# Patient Record
Sex: Female | Born: 1975 | Race: White | Hispanic: No | Marital: Married | Smoking: Never smoker
Health system: Southern US, Community
[De-identification: ages and names within clinical notes are randomized; demographics above are authoritative.]

## PROBLEM LIST (undated history)

## (undated) DIAGNOSIS — D485 Neoplasm of uncertain behavior of skin: Secondary | ICD-10-CM

## (undated) DIAGNOSIS — G43909 Migraine, unspecified, not intractable, without status migrainosus: Secondary | ICD-10-CM

## (undated) DIAGNOSIS — F329 Major depressive disorder, single episode, unspecified: Secondary | ICD-10-CM

## (undated) DIAGNOSIS — F32A Depression, unspecified: Secondary | ICD-10-CM

## (undated) DIAGNOSIS — R112 Nausea with vomiting, unspecified: Secondary | ICD-10-CM

## (undated) DIAGNOSIS — Z889 Allergy status to unspecified drugs, medicaments and biological substances status: Secondary | ICD-10-CM

## (undated) DIAGNOSIS — M4850XA Collapsed vertebra, not elsewhere classified, site unspecified, initial encounter for fracture: Secondary | ICD-10-CM

## (undated) DIAGNOSIS — Z9889 Other specified postprocedural states: Secondary | ICD-10-CM

## (undated) DIAGNOSIS — R109 Unspecified abdominal pain: Secondary | ICD-10-CM

## (undated) DIAGNOSIS — H7290 Unspecified perforation of tympanic membrane, unspecified ear: Secondary | ICD-10-CM

## (undated) DIAGNOSIS — I10 Essential (primary) hypertension: Secondary | ICD-10-CM

## (undated) DIAGNOSIS — N926 Irregular menstruation, unspecified: Secondary | ICD-10-CM

## (undated) HISTORY — PX: ABDOMINAL HYSTERECTOMY: SHX81

## (undated) HISTORY — PX: OTHER SURGICAL HISTORY: SHX169

## (undated) HISTORY — PX: BACK SURGERY: SHX140

## (undated) HISTORY — DX: Unspecified perforation of tympanic membrane, unspecified ear: H72.90

## (undated) HISTORY — DX: Other specified postprocedural states: Z98.890

## (undated) HISTORY — DX: Collapsed vertebra, not elsewhere classified, site unspecified, initial encounter for fracture: M48.50XA

## (undated) HISTORY — PX: MYOMECTOMY: SHX85

## (undated) HISTORY — DX: Allergy status to unspecified drugs, medicaments and biological substances: Z88.9

## (undated) HISTORY — DX: Unspecified abdominal pain: R10.9

## (undated) HISTORY — DX: Migraine, unspecified, not intractable, without status migrainosus: G43.909

## (undated) HISTORY — DX: Depression, unspecified: F32.A

## (undated) HISTORY — DX: Irregular menstruation, unspecified: N92.6

## (undated) HISTORY — PX: ANKLE FRACTURE SURGERY: SHX122

## (undated) HISTORY — DX: Major depressive disorder, single episode, unspecified: F32.9

## (undated) HISTORY — PX: FRACTURE SURGERY: SHX138

## (undated) HISTORY — DX: Neoplasm of uncertain behavior of skin: D48.5

---

## 1997-08-22 ENCOUNTER — Other Ambulatory Visit: Admission: RE | Admit: 1997-08-22 | Discharge: 1997-08-22 | Payer: Self-pay | Admitting: *Deleted

## 1998-10-29 ENCOUNTER — Other Ambulatory Visit: Admission: RE | Admit: 1998-10-29 | Discharge: 1998-10-29 | Payer: Self-pay | Admitting: Obstetrics and Gynecology

## 1999-11-26 ENCOUNTER — Encounter: Payer: Self-pay | Admitting: *Deleted

## 1999-11-26 ENCOUNTER — Encounter: Admission: RE | Admit: 1999-11-26 | Discharge: 1999-11-26 | Payer: Self-pay | Admitting: *Deleted

## 1999-11-30 ENCOUNTER — Other Ambulatory Visit: Admission: RE | Admit: 1999-11-30 | Discharge: 1999-11-30 | Payer: Self-pay | Admitting: *Deleted

## 2000-12-26 ENCOUNTER — Other Ambulatory Visit: Admission: RE | Admit: 2000-12-26 | Discharge: 2000-12-26 | Payer: Self-pay | Admitting: Obstetrics and Gynecology

## 2002-01-12 ENCOUNTER — Other Ambulatory Visit: Admission: RE | Admit: 2002-01-12 | Discharge: 2002-01-12 | Payer: Self-pay | Admitting: Obstetrics and Gynecology

## 2002-03-06 ENCOUNTER — Encounter: Payer: Self-pay | Admitting: *Deleted

## 2002-03-06 ENCOUNTER — Emergency Department (HOSPITAL_COMMUNITY): Admission: EM | Admit: 2002-03-06 | Discharge: 2002-03-06 | Payer: Self-pay | Admitting: *Deleted

## 2003-02-14 ENCOUNTER — Other Ambulatory Visit: Admission: RE | Admit: 2003-02-14 | Discharge: 2003-02-14 | Payer: Self-pay | Admitting: Obstetrics and Gynecology

## 2004-01-06 ENCOUNTER — Emergency Department (HOSPITAL_COMMUNITY): Admission: EM | Admit: 2004-01-06 | Discharge: 2004-01-06 | Payer: Self-pay | Admitting: Emergency Medicine

## 2004-01-21 ENCOUNTER — Encounter: Admission: RE | Admit: 2004-01-21 | Discharge: 2004-01-21 | Payer: Self-pay | Admitting: Gastroenterology

## 2004-02-14 ENCOUNTER — Ambulatory Visit: Payer: Self-pay

## 2004-02-21 ENCOUNTER — Ambulatory Visit (HOSPITAL_COMMUNITY): Admission: RE | Admit: 2004-02-21 | Discharge: 2004-02-21 | Payer: Self-pay | Admitting: Gastroenterology

## 2005-01-22 ENCOUNTER — Ambulatory Visit: Payer: Self-pay | Admitting: Internal Medicine

## 2006-11-30 DIAGNOSIS — F329 Major depressive disorder, single episode, unspecified: Secondary | ICD-10-CM

## 2006-11-30 DIAGNOSIS — F32A Depression, unspecified: Secondary | ICD-10-CM | POA: Insufficient documentation

## 2006-11-30 DIAGNOSIS — F3289 Other specified depressive episodes: Secondary | ICD-10-CM | POA: Insufficient documentation

## 2007-06-19 ENCOUNTER — Ambulatory Visit: Payer: Self-pay | Admitting: Internal Medicine

## 2007-06-19 DIAGNOSIS — R1012 Left upper quadrant pain: Secondary | ICD-10-CM | POA: Insufficient documentation

## 2007-06-19 LAB — CONVERTED CEMR LAB
Beta hcg, urine, semiquantitative: NEGATIVE
Bilirubin Urine: NEGATIVE
Blood in Urine, dipstick: NEGATIVE
Glucose, Urine, Semiquant: NEGATIVE
Ketones, urine, test strip: NEGATIVE
Protein, U semiquant: NEGATIVE

## 2007-08-18 ENCOUNTER — Encounter: Admission: RE | Admit: 2007-08-18 | Discharge: 2007-08-18 | Payer: Self-pay | Admitting: Obstetrics and Gynecology

## 2009-06-30 ENCOUNTER — Ambulatory Visit: Payer: Self-pay | Admitting: Internal Medicine

## 2009-06-30 DIAGNOSIS — R109 Unspecified abdominal pain: Secondary | ICD-10-CM | POA: Insufficient documentation

## 2009-07-11 ENCOUNTER — Encounter: Payer: Self-pay | Admitting: Internal Medicine

## 2009-07-17 ENCOUNTER — Ambulatory Visit (HOSPITAL_COMMUNITY): Admission: RE | Admit: 2009-07-17 | Discharge: 2009-07-17 | Payer: Self-pay | Admitting: Internal Medicine

## 2009-07-31 ENCOUNTER — Encounter: Payer: Self-pay | Admitting: Internal Medicine

## 2010-04-10 ENCOUNTER — Ambulatory Visit (HOSPITAL_COMMUNITY)
Admission: RE | Admit: 2010-04-10 | Discharge: 2010-04-10 | Payer: Self-pay | Source: Home / Self Care | Attending: Obstetrics and Gynecology | Admitting: Obstetrics and Gynecology

## 2010-05-14 NOTE — Assessment & Plan Note (Signed)
Summary: SWOLLEN GLANDS IN GROIN AREA//CCM   Vital Signs:  Patient profile:   36 year old female Height:      65 inches Weight:      188 pounds BMI:     31.40 Temp:     99.0 degrees F oral Pulse rate:   66 / minute BP sitting:   120 / 80  (right arm) Cuff size:   regular  Vitals Entered By: Romualdo Bolk, CMA Duncan Dull) (June 30, 2009 3:55 PM) CC: Pt is having some tenderness in rt groin area around the same place where she had the melanomia removed 06/16/2007. She is concerned that it may have metasized into the lymph nodes. Pt has been there for about 2.5 weeks.   History of Present Illness: [name] comesin today for   for acute visit for above  last visit was 2 years ago.  She has done well since that time.   She had  a pedicure  2 weeks before onset of swelling .Marland KitchenNail  cut very short  but no no pain  2.5 weeks  ago onset of tender area right groin swelling ? LN  unsure .Marland Kitchen No fever or orther swellings. No GU or Gi problems .  It hurts to cough or sneeze and mover hip internal rottation but not to walk.   Atypical spiz nevus  right thigh  removed years ago by Dr Londell Moh   on same leg.  No cat scratch .     Preventive Screening-Counseling & Management  Alcohol-Tobacco     Alcohol drinks/day: 0     Smoking Status: never  Caffeine-Diet-Exercise     Caffeine use/day: 2     Does Patient Exercise: yes  Current Medications (verified): 1)  Zovia 1/35e (28) 1-35 Mg-Mcg  Tabs (Ethynodiol Diac-Eth Estradiol) 2)  Septra Ds 800-160 Mg  Tabs (Sulfamethoxazole-Trimethoprim) 3)  Calcium 500/d 500-200 Mg-Unit  Tabs (Calcium Carbonate-Vitamin D) 4)  Multiple Vitamins   Tabs (Multiple Vitamin)  Allergies (verified): No Known Drug Allergies  Past History:  Care Management: Gynecology: Layne Dermatology: Margo Aye  Social History: Occupation: Divorced Never Smoked Alcohol use-yes Drug use-no Regular exercise-yes works  [employer]   Caffeine use/day:  2  Review of  Systems  The patient denies anorexia, fever, weight loss, weight gain, vision loss, decreased hearing, hoarseness, chest pain, melena, hematochezia, hematuria, incontinence, genital sores, difficulty walking, abnormal bleeding, and angioedema.         no sweats or systemic symptom   Physical Exam  General:  Well-developed,well-nourished,in no acute distress; alert,appropriate and cooperative throughout examination Head:  normocephalic and atraumatic.   Abdomen:  Bowel sounds positive,abdomen soft and non-tender without masses, organomegaly or  noted.  right groin inguinal area is fulll but no dicrete mass and no change with standing.  Msk:  rom nln  of hip pain with internnal rotation but nl with walking.  Pulses:  nl cap arefill  Extremities:  no clubbing cyanosis or edema  Neurologic:  non focal grossly  Skin:  turgor normal, color normal, no ecchymoses, and no petechiae.    old tatoo  right  leg old scar right thigh  shaving bumps folliculitis in pubic groin area no boids or tenderness  Inguinal Nodes:  no L inguinal adenopathy.  right area full but no discrete  Ln      Impression & Recommendations:  Problem # 1:  INGUINAL PAIN, RIGHT (ICD-789.09)  with fullness  but cant feel discrete LN   . However  will rx for reactive LN   and also get surgery consult to assess for poss hernia as this is worse with coughing .       Orders: Surgical Referral (Surgery)  Complete Medication List: 1)  Zovia 1/35e (28) 1-35 Mg-mcg Tabs (Ethynodiol diac-eth estradiol) 2)  Septra Ds 800-160 Mg Tabs (Sulfamethoxazole-trimethoprim) 3)  Calcium 500/d 500-200 Mg-unit Tabs (Calcium carbonate-vitamin d) 4)  Multiple Vitamins Tabs (Multiple vitamin) 5)  Cephalexin 500 Mg Caps (Cephalexin) .Marland Kitchen.. 1 by mouth three times a day for 7 days  Patient Instructions: 1)  take the   antibiotic  as if this is reactive  Lymph node .    2)  Can do surgery   consult   in the meantime to assess for possible hernia.   Prescriptions: CEPHALEXIN 500 MG CAPS (CEPHALEXIN) 1 by mouth three times a day for 7 days  #21 x 0   Entered and Authorized by:   Madelin Headings MD   Signed by:   Madelin Headings MD on 06/30/2009   Method used:   Electronically to        [id number]* (retail)       [address]       [place], [place]  [address]       Ph: [phone]       Fax: [phone]   RxID:   [id number] CEPHALEXIN 500 MG CAPS (CEPHALEXIN) 1 by mouth three times a day for 7 days  #21 x 0   Entered and Authorized by:   Madelin Headings MD   Signed by:   Madelin Headings MD on 06/30/2009   Method used:   Electronically to        [facility]. [phone]* (retail)       [address] [place], [place]  [address]       Ph: [phone]       Fax: [phone]   RxID:   [id number]  [facility] and spoke with Nelda Bucks. I told her to cancel the rx. Romualdo Bolk, CMA Duncan Dull)  June 30, 2009 4:34 PM

## 2010-05-14 NOTE — Consult Note (Signed)
Summary: [facility] Surgery   Imported By: Maryln Gottron 07/28/2009 14:28:50  _____________________________________________________________________  External Attachment:    Type:   Image     Comment:   External Document

## 2010-05-14 NOTE — Procedures (Signed)
Summary: Colonoscopy Report/[facility]  Colonoscopy Report/[facility]   Imported By: Maryln Gottron 08/19/2009 09:15:00  _____________________________________________________________________  External Attachment:    Type:   Image     Comment:   External Document

## 2010-06-22 LAB — HCG, SERUM, QUALITATIVE: Preg, Serum: NEGATIVE

## 2010-06-22 LAB — CBC
Hemoglobin: 12.2 g/dL (ref 12.0–15.0)
MCHC: 33.6 g/dL (ref 30.0–36.0)
RBC: 4.1 MIL/uL (ref 3.87–5.11)
WBC: 9.9 10*3/uL (ref 4.0–10.5)

## 2010-07-10 ENCOUNTER — Ambulatory Visit (INDEPENDENT_AMBULATORY_CARE_PROVIDER_SITE_OTHER): Payer: 59 | Admitting: Internal Medicine

## 2010-07-10 ENCOUNTER — Encounter: Payer: Self-pay | Admitting: Internal Medicine

## 2010-07-10 VITALS — BP 158/98 | HR 66 | Ht 64.25 in | Wt 186.0 lb

## 2010-07-10 DIAGNOSIS — R519 Headache, unspecified: Secondary | ICD-10-CM | POA: Insufficient documentation

## 2010-07-10 DIAGNOSIS — R51 Headache: Secondary | ICD-10-CM | POA: Insufficient documentation

## 2010-07-10 DIAGNOSIS — H02729 Madarosis of unspecified eye, unspecified eyelid and periocular area: Secondary | ICD-10-CM

## 2010-07-10 DIAGNOSIS — I10 Essential (primary) hypertension: Secondary | ICD-10-CM | POA: Insufficient documentation

## 2010-07-10 MED ORDER — LISINOPRIL-HYDROCHLOROTHIAZIDE 10-12.5 MG PO TABS: 1.0000 | ORAL_TABLET | Freq: Every day | ORAL | Status: DC

## 2010-07-10 NOTE — Progress Notes (Signed)
Subjective:    Patient ID: [name], female    DOB: Oct 05, 1975, 35 y.o.   MRN: [id number]  HPI Patient comes in today for an acute visit SDA because of problems with headache and blood pressure. Her last visit with me was a year or more ago. She has been generally well but had a problem with fibroids and had a myomectomy in December of 2011. This was by Dr. Billy Coast. It was noted that she had some elevated blood pressure at that time but felt to be related to the situation. However she has been monitoring her blood pressure since that time including at work with Dr. Ardyth Gal  and her blood pressures have been running 150-160 systolic.    In addition for 3 weeks she's been having some headaches mostly on the right in the afternoons and has been taking Advil Aleve or Excedrin almost every day for 3 weeks. She is also concerned because the right lateral side of her eye lashes are thinned out and not growing back. She has no vision change diplopia syncope or head injury.  She is on combined OCPs for period regulation which is now regular and scant no need for contraception currently.  No tobacco rare alcohol no decongestants currently  Review of Systems Negative for chest pain shortness of breath rest as per history of present illness   she had a respiratory difficulty with a head cold chest cold that lasted 2-3 months. Dr. Merla Riches treated her with azithromycin and prednisone and since that time she is improved  Past Medical History  Diagnosis Date  . Migraines   . Multiple allergies   . Depression   . Abdominal  pain, other specified site   . Menstrual irregularity   . Atypical Spitz nevus    Past Surgical History  Procedure Date  . Hardware l ankle   . Myomectomy     12 2011  with D and c     reports that she has never smoked. She does not have any smokeless tobacco history on file. She reports that she drinks alcohol. She reports that she does not use illicit drugs. family  history includes Arthritis in her other; Cancer in her other; Hypertension in her mother; Mental retardation in her other; and Stroke in her mother. No Known Allergies     Objective:   Physical Exam Well-developed well-nourished in no acute distress. HEENT normocephalic TMs clear eyes PE RRL EOMs are full she does have eye lashes on the right but slightly decreased in the left.  OP clear tongue midline nares patent no congestion Neck supple without masses thyromegaly or bruit Chest:  Clear to A&P without wheezes rales or rhonchi CV:  S1-S2 no gallops or murmurs peripheral perfusion is normal  BP readings =  Both arms  Abdomen:  Sof,t normal bowel sounds without hepatosplenomegaly, no guarding rebound or masses no CVA tenderness No clubbing cyanosis or edema        Assessment & Plan:    Hypertension  Family history and is on combined pills over age 40. Discussed the situation she must stop the estrogen component and we will stop today and she will discuss this with her gynecologist. Implement-diet lifestyle changes and begin medication lisinopril HCTZ pregnancy precautions. We'll followup in about one month have her monitor at home plan laboratory studies at that time. To include thyroid studies.  Headaches:  Seems to be afternoon headaches more right sided some throbbing her taking of ibuprofen and Aleve may  be counterproductive concern about rebound and affect on blood pressure. Given a headache calendar to return when she comes back.

## 2010-07-10 NOTE — Patient Instructions (Signed)
Stop the  OCPs    Contact Dr Billy Coast office about this . Monitor your bp readings Begin medication each day. Avoid sodium.  Dash Diet

## 2010-08-21 ENCOUNTER — Ambulatory Visit: Payer: 59 | Admitting: Internal Medicine

## 2010-08-21 DIAGNOSIS — Z0289 Encounter for other administrative examinations: Secondary | ICD-10-CM

## 2010-08-28 NOTE — Op Note (Signed)
NAMEJENICA, COSTILOW               ACCOUNT NO.:  [id number]   MEDICAL RECORD NO.:  [id number]          PATIENT TYPE:  AMB   LOCATION:  ENDO                         FACILITY:  [facility]   PHYSICIAN:  Petra Kuba, M.D.    DATE OF BIRTH:  1975-08-02   DATE OF PROCEDURE:  02/21/2004  DATE OF DISCHARGE:                                 OPERATIVE REPORT   PROCEDURE:  Colonoscopy.   INDICATIONS:  Patient with a family history of colon cancer and colon polyps  as well as abdominal pain, questionable etiology, nondiagnostic workup to  date.  Consent was signed after risks, benefits, methods, and options  thoroughly discussed in the office.   MEDICINES USED:  Demerol 100 mg, Versed 10 mg, Phenergan 25 mg.   PROCEDURE:  Rectal inspection pertinent for external hemorrhoids, small.  Digital exam was negative.  Video pediatric adjustable colonoscope was  inserted, easily advanced around the colon to the cecum.  This did not  require any abdominal pressure or any position changes.  The cecum was  identified by the appendiceal orifice and the ileocecal valve.  In fact, the  scope was inserted a short way in the terminal ileum, which was normal.  Photo documentation was obtained and the scope was slowly withdrawn.  Prep  was adequate.  There was some minimal liquid stool that required washing and  suctioning, but on slow withdrawal through the colon no abnormalities were  seen.  Once back in the rectum, anorectal pull-through and retroflexion  confirmed some small hemorrhoids.  The scope was straightened and readvanced  a short way up the left side of the colon, air was suctioned, the scope  removed.  The patient tolerated the procedure well.  There was no obvious  immediate complication.   ENDOSCOPIC DIAGNOSES:  1.  Internal-external hemorrhoids.  2.  Otherwise within normal limits to the terminal ileum.   PLAN:  Recheck colon screening in 10 years.  Happy to see back p.r.n.  Would  recommend  trials of other antispasmodic, Levbid, Bentyl, possibly Librax, if  stress was felt playing a role.  Consider laparoscope next by Dr. Billy Coast,  and I believe she had a complete workup including CT, ultrasound, and upper  GI small bowel follow-through, but happy to see back as above.       MEM/MEDQ  D:  02/21/2004  T:  02/22/2004  Job:  [id number]   cc:   Neta Mends. Fabian Sharp, M.D. [facility]   Lenoard Aden, M.D.  [address]  [place]  [place] [address]  Fax: [phone]

## 2010-09-22 ENCOUNTER — Observation Stay (HOSPITAL_COMMUNITY)
Admission: EM | Admit: 2010-09-22 | Discharge: 2010-09-23 | Disposition: A | Discharge status: Home / Self Care | Payer: 59 | Attending: Emergency Medicine | Admitting: Emergency Medicine

## 2010-09-22 DIAGNOSIS — M549 Dorsalgia, unspecified: Principal | ICD-10-CM | POA: Insufficient documentation

## 2010-09-22 LAB — COMPREHENSIVE METABOLIC PANEL
ALT: 11 U/L (ref 0–35)
AST: 13 U/L (ref 0–37)
Alkaline Phosphatase: 44 U/L (ref 39–117)
CO2: 20 mEq/L (ref 19–32)
Chloride: 100 mEq/L (ref 96–112)
Creatinine, Ser: 0.67 mg/dL (ref 0.4–1.2)
GFR calc non Af Amer: >60 mL/min (ref >60)
Total Bilirubin: 0.4 mg/dL (ref 0.3–1.2)

## 2010-09-22 LAB — CBC
MCV: 85.9 fL (ref 78.0–100.0)
Platelets: 346 10*3/uL (ref 150–400)
RBC: 4.39 MIL/uL (ref 3.87–5.11)
WBC: 11.1 10*3/uL — ABNORMAL HIGH (ref 4.0–10.5)

## 2010-09-22 LAB — DIFFERENTIAL
Eosinophils Absolute: 0.2 10*3/uL (ref 0.0–0.7)
Lymphocytes Relative: 30 % (ref 12–46)
Lymphs Abs: 3.4 10*3/uL (ref 0.7–4.0)
Neutrophils Relative %: 61 % (ref 43–77)

## 2010-10-28 ENCOUNTER — Other Ambulatory Visit: Payer: Self-pay | Admitting: Orthopedic Surgery

## 2010-10-28 DIAGNOSIS — M549 Dorsalgia, unspecified: Secondary | ICD-10-CM

## 2010-10-30 MED ORDER — DEXTROSE 5 % IV SOLN: 1.0000 g | Freq: Once | INTRAVENOUS | Status: DC

## 2010-11-02 ENCOUNTER — Inpatient Hospital Stay
Admission: RE | Admit: 2010-11-02 | Discharge: 2010-11-02 | Payer: 59 | Source: Ambulatory Visit | Attending: Orthopedic Surgery | Admitting: Orthopedic Surgery

## 2010-11-02 ENCOUNTER — Other Ambulatory Visit: Payer: 59

## 2010-11-02 DIAGNOSIS — M549 Dorsalgia, unspecified: Secondary | ICD-10-CM

## 2010-11-04 ENCOUNTER — Inpatient Hospital Stay
Admission: RE | Admit: 2010-11-04 | Discharge: 2010-11-04 | Payer: 59 | Source: Ambulatory Visit | Attending: Orthopedic Surgery | Admitting: Orthopedic Surgery

## 2010-11-04 ENCOUNTER — Other Ambulatory Visit: Payer: 59

## 2010-11-04 ENCOUNTER — Ambulatory Visit
Admission: RE | Admit: 2010-11-04 | Discharge: 2010-11-04 | Disposition: A | Discharge status: Home / Self Care | Payer: 59 | Source: Ambulatory Visit | Attending: Orthopedic Surgery | Admitting: Orthopedic Surgery

## 2010-11-04 ENCOUNTER — Other Ambulatory Visit: Payer: Self-pay | Admitting: Orthopedic Surgery

## 2010-11-04 DIAGNOSIS — M549 Dorsalgia, unspecified: Secondary | ICD-10-CM

## 2010-11-04 HISTORY — DX: Essential (primary) hypertension: I10

## 2010-11-04 MED ORDER — MIDAZOLAM HCL 2 MG/2ML IJ SOLN
1.0000 mg | INTRAMUSCULAR | Status: DC | PRN
  Administered 2010-11-04 (×2): 1 mg via INTRAVENOUS

## 2010-11-04 MED ORDER — FENTANYL CITRATE 0.05 MG/ML IJ SOLN
25.0000 ug | INTRAMUSCULAR | Status: DC | PRN
  Administered 2010-11-04 (×2): 50 ug via INTRAVENOUS

## 2010-11-04 MED ORDER — SODIUM CHLORIDE 0.9 % IV SOLN
Freq: Once | INTRAVENOUS | Status: AC
  Administered 2010-11-04: 08:00:00 via INTRAVENOUS

## 2010-11-04 MED ORDER — KETOROLAC TROMETHAMINE 30 MG/ML IJ SOLN
30.0000 mg | Freq: Once | INTRAMUSCULAR | Status: AC
  Administered 2010-11-04: 30 mg via INTRAVENOUS

## 2010-11-04 MED ORDER — IOHEXOL 180 MG/ML  SOLN
4.5000 mL | Freq: Once | INTRAMUSCULAR | Status: AC | PRN
  Administered 2010-11-04: 4.5 mL

## 2010-11-04 MED ORDER — DEXTROSE 5 % IV SOLN
1.0000 g | Freq: Once | INTRAVENOUS | Status: AC
  Administered 2010-11-04: 1 g via INTRAVENOUS

## 2010-11-04 NOTE — Progress Notes (Signed)
Pt's lungs are clear bilaterally, heart sounds are regular. Pt denies smoking.

## 2011-01-20 ENCOUNTER — Other Ambulatory Visit (HOSPITAL_COMMUNITY): Payer: Self-pay | Admitting: Orthopedic Surgery

## 2011-01-20 ENCOUNTER — Encounter (HOSPITAL_COMMUNITY)
Admission: RE | Admit: 2011-01-20 | Discharge: 2011-01-20 | Disposition: A | Discharge status: Home / Self Care | Payer: 59 | Source: Ambulatory Visit | Attending: Orthopedic Surgery | Admitting: Orthopedic Surgery

## 2011-01-20 DIAGNOSIS — Z01811 Encounter for preprocedural respiratory examination: Secondary | ICD-10-CM

## 2011-01-20 LAB — BASIC METABOLIC PANEL
Calcium: 10.1 mg/dL (ref 8.4–10.5)
Creatinine, Ser: 0.78 mg/dL (ref 0.50–1.10)
GFR calc Af Amer: >90 mL/min (ref >90)

## 2011-01-20 LAB — CBC
Hemoglobin: 13.3 g/dL (ref 12.0–15.0)
MCH: 29 pg (ref 26.0–34.0)
RBC: 4.58 MIL/uL (ref 3.87–5.11)

## 2011-01-20 LAB — HCG, SERUM, QUALITATIVE: Preg, Serum: NEGATIVE

## 2011-01-20 LAB — SURGICAL PCR SCREEN: MRSA, PCR: NEGATIVE

## 2011-01-27 ENCOUNTER — Inpatient Hospital Stay (HOSPITAL_COMMUNITY)
Admission: RE | Admit: 2011-01-27 | Discharge: 2011-02-01 | DRG: 460 | Disposition: A | Discharge status: Home Health | Payer: 59 | Source: Ambulatory Visit | Attending: Orthopedic Surgery | Admitting: Orthopedic Surgery

## 2011-01-27 ENCOUNTER — Inpatient Hospital Stay (HOSPITAL_COMMUNITY): Payer: 59

## 2011-01-27 DIAGNOSIS — IMO0002 Reserved for concepts with insufficient information to code with codable children: Secondary | ICD-10-CM

## 2011-01-27 DIAGNOSIS — E669 Obesity, unspecified: Secondary | ICD-10-CM | POA: Diagnosis present

## 2011-01-27 DIAGNOSIS — G8929 Other chronic pain: Secondary | ICD-10-CM | POA: Diagnosis present

## 2011-01-27 DIAGNOSIS — M51379 Other intervertebral disc degeneration, lumbosacral region without mention of lumbar back pain or lower extremity pain: Principal | ICD-10-CM | POA: Diagnosis present

## 2011-01-27 DIAGNOSIS — Z8679 Personal history of other diseases of the circulatory system: Secondary | ICD-10-CM

## 2011-01-27 DIAGNOSIS — M5137 Other intervertebral disc degeneration, lumbosacral region: Principal | ICD-10-CM | POA: Diagnosis present

## 2011-01-27 DIAGNOSIS — F3289 Other specified depressive episodes: Secondary | ICD-10-CM | POA: Diagnosis present

## 2011-01-27 DIAGNOSIS — F411 Generalized anxiety disorder: Secondary | ICD-10-CM | POA: Diagnosis present

## 2011-01-27 DIAGNOSIS — F329 Major depressive disorder, single episode, unspecified: Secondary | ICD-10-CM | POA: Diagnosis present

## 2011-01-27 DIAGNOSIS — I10 Essential (primary) hypertension: Secondary | ICD-10-CM | POA: Diagnosis present

## 2011-01-27 LAB — CBC
Hemoglobin: 8.9 g/dL — ABNORMAL LOW (ref 12.0–15.0)
MCH: 28.7 pg (ref 26.0–34.0)
MCHC: 33.2 g/dL (ref 30.0–36.0)
MCV: 86.5 fL (ref 78.0–100.0)

## 2011-01-27 LAB — HEPATIC FUNCTION PANEL
ALT: 19 U/L (ref 0–35)
AST: 19 U/L (ref 0–37)
Albumin: 3.7 g/dL (ref 3.5–5.2)
Bilirubin, Direct: <0.1 mg/dL (ref 0.0–0.3)

## 2011-01-27 LAB — DIFFERENTIAL
Basophils Relative: 1 % (ref 0–1)
Eosinophils Absolute: 0.2 10*3/uL (ref 0.0–0.7)
Lymphs Abs: 2.9 10*3/uL (ref 0.7–4.0)
Monocytes Absolute: 0.7 10*3/uL (ref 0.1–1.0)
Monocytes Relative: 9 % (ref 3–12)

## 2011-01-27 LAB — APTT: aPTT: 26 seconds (ref 24–37)

## 2011-01-28 ENCOUNTER — Inpatient Hospital Stay (HOSPITAL_COMMUNITY): Payer: 59

## 2011-01-28 DIAGNOSIS — M7989 Other specified soft tissue disorders: Secondary | ICD-10-CM

## 2011-01-28 LAB — POCT I-STAT 7, (LYTES, BLD GAS, ICA,H+H)
Acid-base deficit: 1 mmol/L (ref 0.0–2.0)
Calcium, Ion: 1.11 mmol/L — ABNORMAL LOW (ref 1.12–1.32)
HCT: 29 % — ABNORMAL LOW (ref 36.0–46.0)
O2 Saturation: 100 %
Potassium: 3.4 mEq/L — ABNORMAL LOW (ref 3.5–5.1)
Sodium: 134 mEq/L — ABNORMAL LOW (ref 135–145)
pO2, Arterial: 467 mmHg — ABNORMAL HIGH (ref 80.0–100.0)

## 2011-01-28 LAB — CBC
HCT: 27.8 % — ABNORMAL LOW (ref 36.0–46.0)
MCH: 28.6 pg (ref 26.0–34.0)
MCHC: 32.7 g/dL (ref 30.0–36.0)
MCV: 87.4 fL (ref 78.0–100.0)
Platelets: 254 10*3/uL (ref 150–400)
RDW: 12.6 % (ref 11.5–15.5)
WBC: 9.3 10*3/uL (ref 4.0–10.5)

## 2011-01-28 LAB — POCT I-STAT 4, (NA,K, GLUC, HGB,HCT)
Glucose, Bld: 147 mg/dL — ABNORMAL HIGH (ref 70–99)
Glucose, Bld: 148 mg/dL — ABNORMAL HIGH (ref 70–99)
HCT: 23 % — ABNORMAL LOW (ref 36.0–46.0)
Hemoglobin: 7.8 g/dL — ABNORMAL LOW (ref 12.0–15.0)
Potassium: 4.8 mEq/L (ref 3.5–5.1)
Sodium: 133 mEq/L — ABNORMAL LOW (ref 135–145)

## 2011-01-28 NOTE — Op Note (Signed)
NAMEYACHET, MATTSON NO.:  [id number]  MEDICAL RECORD NO.:  [id number]  LOCATION:  5031                         FACILITY:  [facility]  PHYSICIAN:  Estill Bamberg, MD      DATE OF BIRTH:  1976-01-08  DATE OF PROCEDURE:  01/27/2011 DATE OF DISCHARGE:                              OPERATIVE REPORT   PREOPERATIVE DIAGNOSES: 1. L5-S1 degenerative disk disease 2. 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  POSTOPERATIVE DIAGNOSES: 1. L5-S1 degenerative disk disease 2. L4-5, L5-S1 facetogenic disease     causing severe low back pain.  PROCEDURES: 1. Anterior lumbar interbody fusion L4-5, L5-S1 with exposure     performed by Dr. Tawanna Cooler Early.  I did function as first assistant     throughout the exposure. 2. Insertion of anterior lumbar interbody device (12-mm, 10-degree     lordotic Krueger cage at L5-S1, 12-mm, 5-degree lordotic small cage     at L4-5). 3. Intraoperative bone marrow aspiration from the patient's left iliac     crest via a separate incision. 4. Insertion of posterior instrumentation L4, L5, S1. 5. Posterior spinal fusion, L4-5, L5-S1. 6. Intraoperative use of fluoroscopy.  SURGEONS:  Estill Bamberg, MD  ASSISTANT:  Janace Litten, OPA for the posterior aspect of the procedure.  COMPLICATIONS:  None.  DISPOSITION:  Stable.  ESTIMATED BLOOD LOSS:  Minimal.  INDICATIONS FOR PROCEDURE:  Briefly, Ms. Strick is a very pleasant 35- year-old female who initially presented to my office on November 30, 2010, with severe and debilitating pain in the low back.  The patient did go forward with extensive conservative care.  Of note, the patient did have a diskogram significant for concordant pain at the L5-S1 level.  Also of note, the patient did have very significant improvement with bilateral facet injections at the L4-5 and L5-S1 levels.  Again, she did go forward with extensive conservative care and did continue pain in approximately  8/10.  We, therefore, did have a discussion regarding going forward with an L4-5 and L5-S1 anterior lumbar interbody fusion with posterior spinal fusion instrumentation.  The patient fully understood the risks and limitations of the procedure as outlined in my preoperative note.  OPERATIVE DETAILS:  On January 27, 2011, the patient was brought to surgery and general endotracheal anesthesia was administered. Antibiotics were given.  SCDs were placed.  The patient was placed supine on a flat Jackson table.  The abdomen was prepped and draped in the usual sterile fashion.  A vertical incision was then made by Dr. Tawanna Cooler Early and the approach was subsequently performed by Dr. Gretta Began.  Again, I did function as first assistant throughout the exposure.  We did ultimately gain access to the L5-S1 interspace.  A thorough and complete diskectomy was performed using a knife as well as a series of curettes and pituitary rongeurs.  I was very pleased with the thorough diskectomy and preparation of the endplates above and below.  I did go forward at this point with placing a series of trials. I did feel that a 12-mm small Krueger trial with 10 degrees of lordosis would be the most appropriate fit.  At this point,  I did obtain a total of 12 mL of bone marrow aspirate from the patient's left iliac crest via separate incision.  This was mixed with a total of 15 mL of Vitoss BA. This was packed into the Krueger cage and tamped into position in the usual fashion under AP and lateral fluoroscopy.  I was very pleased with the final press fit.  Of note, distraction was applied across the endplates well placing the interbody device.  I then turned my attention towards the patient's L4-5 interspace.  A diskectomy was performed in the manner described previously.  At this particular level, I did feel that a small 12- mm interbody graft with 5 degrees of lordosis should be the most appropriate fit.  This again  was filled with the Vitoss BA/bone marrow aspirate mixture and was tamped into position in the usual fashion.  Again, I was very pleased with the final press fit, and with the AP and lateral fluoroscopic views.  The abdomen was then copiously irrigated.  The fascia was closed using #1 PDS.  The subcutaneous layer was closed using 2-0 Vicryl and the skin was closed using 3-0 Monocryl. A sterile dressing was then applied.  The patient was then turned prone in the rotatory fashion.  The back was then prepped and draped in the usual sterile fashion.  Time-out procedure was again performed.  I then brought in AP fluoroscopy in the midline as well as the lateral border of the pedicles was defined and marked out using a marker.  The level of the L4-L5 and S1 pedicles were also identified and marked.  This was all done after prepping and draping the back.  I then made a 4-1/2 cm incision approximately 3-cm lateral to the lateral border of the pedicles.  The fascia was then incised.  At this point, I did bring in a minimally invasive tubular retractor, which was centered over the L4-5 facet on the right side.  The L4-5 facet was subperiosteally exposed and I did use a 3-mm high-speed bur to thoroughly decorticate the L4-5 facet.  I then packed the L4-5 facet joint with Vitoss BA and bone marrow aspirate.  At a minimally invasive fashion, I then exposed the L5- S1 facet joint in the right side.  Again, it was decorticated and the trough that was created was still with Vitoss BA and bone marrow aspirate.  This was then done to the facet joints on the left sides in a minimally invasive fashion.  I then went forward with cannulating the L4- L5 and S1 pedicle screws.  I did use a Jamshidi to gain access into the pedicles and guidewires were placed across all 6 pedicles.  Per my preoperative templating, I did use a 5-mm tap at L4 and a 6-mm tap at L5 and S1.  The taps were tested using triggered EMG and  there is no triggered EMG potential at less than 25 milliamps.  I then placed 6 x 45 mm screws at L4 and 7 x 40 mm screws at L5 and S1.  Again, Webril AP and lateral fluoroscopy was utilized throughout this aspect of the procedure.  I then subfascial placed a 65-mm rod in the left and a 7-mm rod on the right.  Caps were placed and a final locking procedure was performed.  I was very happy with the final AP and lateral fluoroscopic views.  I then copiously irrigated the wounds on the right and the left sides.  The fascia was closed using #  1 Vicryl.  The subcutaneous layer was closed using 2-0 Vicryl and the skin was closed using 3-0 Monocryl. Of note, Janace Litten, was my assistant throughout the posterior aspect of the procedure and aided in essential retraction suctioning and placement of the hardware.  All instrument counts were correct at the termination of the procedure.     Estill Bamberg, MD     MD/MEDQ  D:  01/27/2011  T:  01/28/2011  Job:  [id number]  cc:   Neta Mends. Fabian Sharp, MD  Electronically Signed by Estill Bamberg  on 01/28/2011 12:19:51 PM

## 2011-01-29 ENCOUNTER — Other Ambulatory Visit (HOSPITAL_COMMUNITY): Payer: 59

## 2011-01-29 LAB — CBC
HCT: 27.4 % — ABNORMAL LOW (ref 36.0–46.0)
MCH: 29 pg (ref 26.0–34.0)
MCV: 87.3 fL (ref 78.0–100.0)
Platelets: 255 10*3/uL (ref 150–400)
RDW: 12.7 % (ref 11.5–15.5)
WBC: 15 10*3/uL — ABNORMAL HIGH (ref 4.0–10.5)

## 2011-01-29 NOTE — Op Note (Signed)
[name], [name] NO.:  [id number]  MEDICAL RECORD NO.:  [id number]  LOCATION:  5031                         FACILITY:  [facility]  PHYSICIAN:  Larina Earthly, M.D.    DATE OF BIRTH:  06-18-1975  DATE OF PROCEDURE:  01/27/2011 DATE OF DISCHARGE:                              OPERATIVE REPORT   PREOPERATIVE DIAGNOSIS:  Severe degenerative disk disease.  POSTOP DIAGNOSIS:  Severe degenerative disk disease.  PROCEDURE:  Two-level anterior lumbar interbody fusion.  CO-SURGEONS FOR THE EXPOSURE:  Dr. Tawanna Cooler Early and Dr. Estill Bamberg.  ANESTHESIA:  General endotracheal.  COMPLICATIONS:  None.  DISPOSITION:  To recovery room stable.  INDICATIONS FOR PROCEDURE:  The patient is a 35 year old female with progressive severe degenerative disk disease.  She was seen preoperatively by Dr. Estill Bamberg who felt that her ultimate treatment would be with anterior and posterior approach.  I discussed this with [name] explaining my role for mobilization of the intraperitoneal contents of left ureteroiliacs and potential injury to these.  She wished to proceed with surgery.  She did have transitional anatomy.  PROCEDURE IN DETAIL:  The patient was taken to the operating room and placed in supine position where the area of the abdomen was prepped and draped in usual sterile fashion.  C-arm projection was used to expose the level of the 2 disks that were of concern.  A paramedian incision was made in the left lower quadrant and was carried down through the subcutaneous fat with electrocautery.  The fat was mobilized off the anterior fascia.  The anterior fascia was opened with electrocautery in line with the skin incision.  The rectus muscle was mobilized circumferentially.  The retroperitoneum was entered laterally in the left lower quadrant and the peritoneal contents were reflected with the peritoneal sac to the right.  The peritoneum was not entered.  The posterior  sheath was opened laterally after mobilizing the peritoneum. The rectus muscles were then brought laterally as well, and a blunt dissection was used to continue mobilization.  The distal body of the exposure was approached through between the level of the iliac vessels. The patient had large uterosacral vessel was ligated with 2-0 silk ties and divided.  Blunt dissection was used to get adequate exposure.  After there had been adequate mobilization to the right and left of the distal disk, the more proximal disk was exposed bilaterally in the left iliac artery and vein to retract and reflect it to the right. Intercostal vessels were divided to give for mobilization.  The lumbar vein was ligated with 2-0 silk ties and divided as well.  The Brau retractor was brought onto the field and the 150 reversed Brau blades were used to expose the right and left of the distal disk.  The needle was placed in this with a lateral projection to confirm this was indeed the correct lower disk space.  The remainder of this portion of the diskectomy and fusion will be dictated in a separate note by Dr. Estill Bamberg.  Following this level of fusion, I re-scrubbed in and repositioned the Brau retractors to the more proximal disk level.  Exposure was adequate for a  fusion which will be dictated in a separate note by Dr. Estill Bamberg.  The patient tolerated the procedure without complication, and transferred to the recovery room in stable condition.     Larina Earthly, M.D.     TFE/MEDQ  D:  01/27/2011  T:  01/28/2011  Job:  [id number]  Electronically Signed by TODD EARLY M.D. on 01/29/2011 12:55:42 PM

## 2011-02-01 LAB — TYPE AND SCREEN
ABO/RH(D): O POS
Antibody Screen: NEGATIVE
Unit division: 0

## 2011-02-07 ENCOUNTER — Emergency Department (HOSPITAL_COMMUNITY)
Admission: EM | Admit: 2011-02-07 | Discharge: 2011-02-08 | Disposition: A | Discharge status: Home / Self Care | Payer: 59 | Attending: Emergency Medicine | Admitting: Emergency Medicine

## 2011-02-07 ENCOUNTER — Emergency Department (HOSPITAL_COMMUNITY): Payer: 59

## 2011-02-07 DIAGNOSIS — R109 Unspecified abdominal pain: Secondary | ICD-10-CM | POA: Insufficient documentation

## 2011-02-07 DIAGNOSIS — R509 Fever, unspecified: Secondary | ICD-10-CM | POA: Insufficient documentation

## 2011-02-07 DIAGNOSIS — I10 Essential (primary) hypertension: Secondary | ICD-10-CM | POA: Insufficient documentation

## 2011-02-07 DIAGNOSIS — Z79899 Other long term (current) drug therapy: Secondary | ICD-10-CM | POA: Insufficient documentation

## 2011-02-07 DIAGNOSIS — R209 Unspecified disturbances of skin sensation: Secondary | ICD-10-CM | POA: Insufficient documentation

## 2011-02-07 DIAGNOSIS — M549 Dorsalgia, unspecified: Secondary | ICD-10-CM | POA: Insufficient documentation

## 2011-02-07 DIAGNOSIS — M79609 Pain in unspecified limb: Secondary | ICD-10-CM | POA: Insufficient documentation

## 2011-02-08 ENCOUNTER — Emergency Department (HOSPITAL_COMMUNITY): Payer: 59

## 2011-02-08 ENCOUNTER — Encounter (HOSPITAL_COMMUNITY): Payer: Self-pay

## 2011-02-08 DIAGNOSIS — M79609 Pain in unspecified limb: Secondary | ICD-10-CM

## 2011-02-08 DIAGNOSIS — M7989 Other specified soft tissue disorders: Secondary | ICD-10-CM

## 2011-02-08 LAB — CBC
MCV: 87 fL (ref 78.0–100.0)
Platelets: 489 10*3/uL — ABNORMAL HIGH (ref 150–400)
RBC: 3.77 MIL/uL — ABNORMAL LOW (ref 3.87–5.11)
RDW: 12.7 % (ref 11.5–15.5)
WBC: 13.4 10*3/uL — ABNORMAL HIGH (ref 4.0–10.5)

## 2011-02-08 LAB — URINE MICROSCOPIC-ADD ON

## 2011-02-08 LAB — DIFFERENTIAL
Basophils Relative: 0 % (ref 0–1)
Eosinophils Absolute: 0.3 10*3/uL (ref 0.0–0.7)
Eosinophils Relative: 2 % (ref 0–5)
Neutrophils Relative %: 72 % (ref 43–77)

## 2011-02-08 LAB — URINALYSIS, ROUTINE W REFLEX MICROSCOPIC
Bilirubin Urine: NEGATIVE
Nitrite: NEGATIVE
Protein, ur: NEGATIVE mg/dL
Specific Gravity, Urine: 1.024 (ref 1.005–1.030)
Urobilinogen, UA: 0.2 mg/dL (ref 0.0–1.0)

## 2011-02-08 LAB — D-DIMER, QUANTITATIVE: D-Dimer, Quant: 1 ug/mL-FEU — ABNORMAL HIGH (ref 0.00–0.48)

## 2011-02-08 LAB — POCT PREGNANCY, URINE: Preg Test, Ur: NEGATIVE

## 2011-02-08 MED ORDER — IOHEXOL 300 MG/ML  SOLN
100.0000 mL | Freq: Once | INTRAMUSCULAR | Status: AC | PRN
  Administered 2011-02-08: 100 mL via INTRAVENOUS

## 2011-02-16 ENCOUNTER — Other Ambulatory Visit: Payer: Self-pay | Admitting: Internal Medicine

## 2011-02-19 NOTE — Discharge Summary (Signed)
NAMELYNORA, [name] NO.:  [id number]  MEDICAL RECORD NO.:  [id number]  LOCATION:  5031                         FACILITY:  [facility]  PHYSICIAN:  Estill Bamberg, MD      DATE OF BIRTH:  24-Apr-1975  DATE OF ADMISSION:  01/27/2011 DATE OF DISCHARGE:  02/01/2011                              DISCHARGE SUMMARY   ADMISSION DIAGNOSES: 1. L5-S1 degenerative disk disease. 2. L4-5, L5-S1 facet joint disease resulting in severe low back pain.  DISCHARGE DIAGNOSES: 1. L5-S1 degenerative disk disease. 2. L4-5, L5-S1 facet joint disease resulting in severe low back pain.  ADMITTING PHYSICIAN:  Estill Bamberg, MD  ADMISSION HISTORY:  Briefly, Ms. Sochacki is a very pleasant 35 year old female who initially presented to my office with severe debilitating pain in the low back.  She did have a diskogram notable for concordant pain at the L5-S1 level and she did get a positive response with L4-5 and L5-S1 facet injections.  She did have extensive conservative care. We did continue to go on to complain of severe pain in the low back.  We therefore did have a discussion regarding going forward with anterior and posterior lumbar fusion procedure.  The patient was therefore admitted on January 27, 2011, for the procedure noted above.  HOSPITAL COURSE:  On January 27, 2011, the patient was brought to Surgery and underwent the procedure as noted above.  The procedure was uneventful and the patient was transferred to recovery in stable condition.  The patient was evaluated by me on the morning of postoperative day #1.  Of note, the patient did report severe pain in the left groin and tingling in the left leg on the morning of postoperative day #1.  Her pain was noted to be severe and located in the groin.  I did feel this was likely secondary to the retraction required for the anterior exposure.  The patient was evaluated by the approach surgeon, Dr. Tawanna Cooler Early who did not identify any  abnormal findings on her exam.  She did have a trace of weakness at the EHL and dorsiflexion on the left side.  Given the severity of her pain, I did go forward with an MRI of the patient's lumbar spine in addition to a CAT scan of the patient's lumbar spine.  The CAT scan did confirm that the hardware was appropriately positioned.  The MRI was 100% negative for any nerve compression identified throughout the entire lumbar spine region.  The patient also went on to complain of swelling in the abdomen in the area of the incision.  This again was evaluated by me and Dr. Gretta Began.  We both felt this was normal postoperative swelling and was not of any concern.  The patient's groin pain did improve over time. She did going to have numbness and pain in the left leg.  They get her started on Neurontin.  The patient was also given OxyContin.  Her pain did improve.  She ultimately was discharged home on postoperative day #5 with improved groin pain, but improved pain in the left leg.  The remainder of the patient's postoperative course was unremarkable.  DISCHARGE INSTRUCTIONS:  The patient will  continue to take OxyContin in addition of Neurontin.  She will follow up in my office at approximately 2 weeks after her procedure.  I will be sure to keep a close eye on the patient and will be contacting her over the telephone to additionally discuss.  I did go on and explained to her that I do feel that the postoperative pain in the left leg is likely related to retraction and I do strongly feel that this will improve in time given the fact that her MRI and CAT scan were entirely negative.  I will see her back in approximately 2 weeks after her surgery.     Estill Bamberg, MD     MD/MEDQ  D:  02/12/2011  T:  02/13/2011  Job:  [id number]  Electronically Signed by Estill Bamberg  on 02/19/2011 04:39:05 PM

## 2011-07-25 ENCOUNTER — Emergency Department (HOSPITAL_BASED_OUTPATIENT_CLINIC_OR_DEPARTMENT_OTHER)
Admission: EM | Admit: 2011-07-25 | Discharge: 2011-07-26 | Disposition: A | Discharge status: Home / Self Care | Payer: 59 | Attending: Emergency Medicine | Admitting: Emergency Medicine

## 2011-07-25 ENCOUNTER — Encounter (HOSPITAL_BASED_OUTPATIENT_CLINIC_OR_DEPARTMENT_OTHER): Payer: Self-pay | Admitting: *Deleted

## 2011-07-25 DIAGNOSIS — M545 Low back pain, unspecified: Secondary | ICD-10-CM | POA: Insufficient documentation

## 2011-07-25 DIAGNOSIS — L02212 Cutaneous abscess of back [any part, except buttock]: Secondary | ICD-10-CM

## 2011-07-25 DIAGNOSIS — Z981 Arthrodesis status: Secondary | ICD-10-CM | POA: Insufficient documentation

## 2011-07-25 DIAGNOSIS — R209 Unspecified disturbances of skin sensation: Secondary | ICD-10-CM | POA: Insufficient documentation

## 2011-07-25 DIAGNOSIS — M549 Dorsalgia, unspecified: Secondary | ICD-10-CM | POA: Insufficient documentation

## 2011-07-25 DIAGNOSIS — I10 Essential (primary) hypertension: Secondary | ICD-10-CM | POA: Insufficient documentation

## 2011-07-25 DIAGNOSIS — L02219 Cutaneous abscess of trunk, unspecified: Secondary | ICD-10-CM | POA: Insufficient documentation

## 2011-07-25 DIAGNOSIS — L03319 Cellulitis of trunk, unspecified: Secondary | ICD-10-CM | POA: Insufficient documentation

## 2011-07-25 NOTE — ED Notes (Signed)
Pt states that she had surgery in October and recently returned to work noticed a swollen nodule above her incision on Friday that is tender to the touch

## 2011-07-26 ENCOUNTER — Emergency Department (INDEPENDENT_AMBULATORY_CARE_PROVIDER_SITE_OTHER): Payer: 59

## 2011-07-26 DIAGNOSIS — R209 Unspecified disturbances of skin sensation: Secondary | ICD-10-CM

## 2011-07-26 DIAGNOSIS — IMO0001 Reserved for inherently not codable concepts without codable children: Secondary | ICD-10-CM

## 2011-07-26 DIAGNOSIS — Z981 Arthrodesis status: Secondary | ICD-10-CM

## 2011-07-26 DIAGNOSIS — M545 Low back pain, unspecified: Secondary | ICD-10-CM

## 2011-07-26 LAB — DIFFERENTIAL
Lymphocytes Relative: 34 % (ref 12–46)
Lymphs Abs: 3.1 10*3/uL (ref 0.7–4.0)
Monocytes Absolute: 1 10*3/uL (ref 0.1–1.0)
Monocytes Relative: 10 % (ref 3–12)
Neutro Abs: 4.9 10*3/uL (ref 1.7–7.7)
Neutrophils Relative %: 53 % (ref 43–77)

## 2011-07-26 LAB — BASIC METABOLIC PANEL
BUN: 13 mg/dL (ref 6–23)
CO2: 26 mEq/L (ref 19–32)
Calcium: 9.5 mg/dL (ref 8.4–10.5)
Chloride: 101 mEq/L (ref 96–112)
Creatinine, Ser: 0.7 mg/dL (ref 0.50–1.10)
GFR calc Af Amer: >90 mL/min (ref >90)
GFR calc non Af Amer: >90 mL/min (ref >90)
Glucose, Bld: 91 mg/dL (ref 70–99)
Potassium: 4.2 mEq/L (ref 3.5–5.1)
Sodium: 138 mEq/L (ref 135–145)

## 2011-07-26 LAB — CBC
HCT: 36.5 % (ref 36.0–46.0)
Hemoglobin: 12.2 g/dL (ref 12.0–15.0)
MCH: 28.6 pg (ref 26.0–34.0)
MCHC: 33.4 g/dL (ref 30.0–36.0)
MCV: 85.5 fL (ref 78.0–100.0)
Platelets: 342 10*3/uL (ref 150–400)
RBC: 4.27 MIL/uL (ref 3.87–5.11)
RDW: 14.1 % (ref 11.5–15.5)
WBC: 9.3 10*3/uL (ref 4.0–10.5)

## 2011-07-26 MED ORDER — DOXYCYCLINE HYCLATE 100 MG PO TABS
100.0000 mg | ORAL_TABLET | Freq: Once | ORAL | Status: AC
  Administered 2011-07-26: 100 mg via ORAL
  Filled 2011-07-26: qty 1

## 2011-07-26 MED ORDER — DOXYCYCLINE HYCLATE 100 MG PO CAPS: 100.0000 mg | ORAL_CAPSULE | Freq: Two times a day (BID) | ORAL | Status: DC

## 2011-07-26 MED ORDER — ONDANSETRON HCL 4 MG/2ML IJ SOLN
4.0000 mg | Freq: Once | INTRAMUSCULAR | Status: AC
  Administered 2011-07-26: 4 mg via INTRAVENOUS
  Filled 2011-07-26: qty 2

## 2011-07-26 MED ORDER — FENTANYL CITRATE 0.05 MG/ML IJ SOLN
50.0000 ug | Freq: Once | INTRAMUSCULAR | Status: AC
  Administered 2011-07-26: 50 ug via INTRAVENOUS
  Filled 2011-07-26: qty 2

## 2011-07-26 MED ORDER — HYDROCODONE-ACETAMINOPHEN 5-325 MG PO TABS: 1.0000 | ORAL_TABLET | Freq: Four times a day (QID) | ORAL | Status: AC | PRN

## 2011-07-26 MED ORDER — SODIUM CHLORIDE 0.9 % IV SOLN
INTRAVENOUS | Status: DC
  Administered 2011-07-26: 01:00:00 via INTRAVENOUS

## 2011-07-26 MED ORDER — IOHEXOL 300 MG/ML  SOLN
100.0000 mL | Freq: Once | INTRAMUSCULAR | Status: AC | PRN
  Administered 2011-07-26: 100 mL via INTRAVENOUS

## 2011-07-26 MED ORDER — DOXYCYCLINE HYCLATE 100 MG PO CAPS: 100.0000 mg | ORAL_CAPSULE | Freq: Two times a day (BID) | ORAL | Status: AC

## 2011-07-26 NOTE — ED Notes (Signed)
Patient transported to CT and returned 

## 2011-07-26 NOTE — ED Notes (Signed)
rx x 2 given for doxycycline and norco- pt instructed to f/u in 3 days for wound recheck

## 2011-07-26 NOTE — Discharge Instructions (Signed)

## 2011-07-26 NOTE — ED Provider Notes (Signed)
History   Scribed for Hanley Seamen, MD, the patient was seen in room MH11/MH11 . This chart was scribed by Lewanda Rife.   CSN: [id number]  Arrival date & time 07/25/11  2155   First MD Initiated Contact with Patient 07/26/11 0000      Chief Complaint  Patient presents with  . Back Pain    (Consider location/radiation/quality/duration/timing/severity/associated sxs/prior treatment) HPI [name] is a 36 y.o. female who presents to the Emergency Department complaining of severe lumbar back pain for the past 3 days. Pt states pain radiates down buttocks. Pt describes the pain as "deep in her spine" and tingly. Pt reports she had back surgery in October 2012 to repair 2 ruptured discs and had discectomy and fusion of L5 and S1. Pt states she's had residual tingling down both legs since the surgery and takes neurontin to treat symptoms. Pt states tingling down legs is unchanged. Pt denies any urinary symptoms,and abdominal pain. Pt states she's been taking ibuprofen for pain with no relief.   Past Medical History  Diagnosis Date  . Migraines   . Multiple allergies   . Depression   . Abdominal  pain, other specified site   . Menstrual irregularity   . Atypical Spitz nevus   . Hypertension     oral meds    Past Surgical History  Procedure Date  . Hardware l ankle   . Myomectomy     12 2011  with D and c   . Ankle fracture surgery     hardware removed 13 years ago  . Back surgery     Family History  Problem Relation Age of Onset  . Arthritis Other   . Cancer Other     breast  . Hypertension Mother   . Mental retardation Other   . Stroke Mother     in her 18s     History  Substance Use Topics  . Smoking status: Never Smoker   . Smokeless tobacco: Not on file  . Alcohol Use: Yes    OB History    Grav Para Term Preterm Abortions TAB SAB Ect Mult Living                  Review of Systems  Constitutional: Negative.   HENT: Negative.   Respiratory:  Negative.   Cardiovascular: Negative.   Gastrointestinal: Negative.   Musculoskeletal: Positive for back pain.  Skin: Negative.   Neurological: Negative.   Hematological: Negative.   Psychiatric/Behavioral: Negative.   All other systems reviewed and are negative.    Allergies  Review of patient's allergies indicates no known allergies.  Home Medications   Current Outpatient Rx  Name Route Sig Dispense Refill  . CALCIUM CARBONATE-VITAMIN D 500-200 MG-UNIT PO TABS Oral Take 1 tablet by mouth daily.      Marland Kitchen CITALOPRAM HYDROBROMIDE 20 MG PO TABS Oral Take 20 mg by mouth daily.      Marland Kitchen LISINOPRIL-HYDROCHLOROTHIAZIDE 10-12.5 MG PO TABS  1 daily Pt needs to schedule a follow up appt before next refill. 90 tablet 0  . MULTIPLE VITAMINS PO Oral Take by mouth daily.      . SULFAMETHOXAZOLE-TMP DS 800-160 MG PO TABS Oral Take 1 tablet by mouth 2 (two) times daily. As needed for acne      BP 159/103  Pulse 94  Temp(Src) 97.7 F (36.5 C) (Oral)  Resp 18  Ht 5\' 5"  (1.651 m)  Wt 175 lb (79.379 kg)  BMI  29.12 kg/m2  SpO2 100%  LMP 07/25/2011  Physical Exam  Nursing note and vitals reviewed. Constitutional: She is oriented to person, place, and time. She appears well-developed and well-nourished.  HENT:  Head: Normocephalic and atraumatic.  Mouth/Throat: Oropharynx is clear and moist.  Eyes: Conjunctivae are normal. Pupils are equal, round, and reactive to light.  Neck: Normal range of motion. Neck supple. No tracheal deviation present. No thyromegaly present.  Cardiovascular: Normal rate and regular rhythm.   No murmur heard. Pulmonary/Chest: Effort normal and breath sounds normal. She has no wheezes. She has no rales. She exhibits no tenderness.  Abdominal: Soft. Bowel sounds are normal. She exhibits no distension. There is no tenderness. There is no rebound and no guarding.  Musculoskeletal: Normal range of motion. She exhibits no edema and no tenderness.       2 vertical para  lumbar incisions noted and healing well  Moderate tenderness over erythematous raised lesion superior to left incision about 2 cm in diameter  Neurological: She is alert and oriented to person, place, and time. Coordination normal.  Skin: Skin is warm and dry. No rash noted.  Psychiatric: She has a normal mood and affect.    ED Course  Procedures (including critical care time)  INCISION AND DRAINAGE Performed by: Paula Libra L Consent: Verbal consent obtained. Risks and benefits: risks, benefits and alternatives were discussed Type: abscess  Body area: Left lumbar region superior and medial to superior aspect of left surgical incision  Anesthesia: local infiltration  Local anesthetic: lidocaine 2% with epinephrine  Anesthetic total: 1 ml  Complexity: Simple Blunt dissection to break up loculations  Drainage: purulent  Drainage amount: Small   Packing material: 1/4 in iodoform gauze  Patient tolerance: Patient tolerated the procedure well with no immediate complications.      MDM   Nursing notes and vitals signs, including pulse oximetry, reviewed.  Summary of this visit's results, reviewed by myself:  Labs:  Results for orders placed during the hospital encounter of 07/25/11  CBC      Component Value Range   WBC 9.3  4.0 - 10.5 (K/uL)   RBC 4.27  3.87 - 5.11 (MIL/uL)   Hemoglobin 12.2  12.0 - 15.0 (g/dL)   HCT 16.1  09.6 - 04.5 (%)   MCV 85.5  78.0 - 100.0 (fL)   MCH 28.6  26.0 - 34.0 (pg)   MCHC 33.4  30.0 - 36.0 (g/dL)   RDW 40.9  81.1 - 91.4 (%)   Platelets 342  150 - 400 (K/uL)  DIFFERENTIAL      Component Value Range   Neutrophils Relative 53  43 - 77 (%)   Neutro Abs 4.9  1.7 - 7.7 (K/uL)   Lymphocytes Relative 34  12 - 46 (%)   Lymphs Abs 3.1  0.7 - 4.0 (K/uL)   Monocytes Relative 10  3 - 12 (%)   Monocytes Absolute 1.0  0.1 - 1.0 (K/uL)   Eosinophils Relative 2  0 - 5 (%)   Eosinophils Absolute 0.2  0.0 - 0.7 (K/uL)   Basophils Relative 0  0  - 1 (%)   Basophils Absolute 0.0  0.0 - 0.1 (K/uL)  BASIC METABOLIC PANEL      Component Value Range   Sodium 138  135 - 145 (mEq/L)   Potassium 4.2  3.5 - 5.1 (mEq/L)   Chloride 101  96 - 112 (mEq/L)   CO2 26  19 - 32 (mEq/L)   Glucose, Bld 91  70 - 99 (mg/dL)   BUN 13  6 - 23 (mg/dL)   Creatinine, Ser 1.61  0.50 - 1.10 (mg/dL)   Calcium 9.5  8.4 - 09.6 (mg/dL)   GFR calc non Af Amer >90  >90 (mL/min)   GFR calc Af Amer >90  >90 (mL/min)    Imaging Studies: Ct Lumbar Spine W Contrast  07/26/2011  *RADIOLOGY REPORT*  Clinical Data: Lower back pain radiating down the buttocks for 3 days; numbness and tingling along the legs.  History of L4-S1 fusion in October 2012.  CT LUMBAR SPINE WITH CONTRAST  Technique:  Multidetector CT imaging of the lumbar spine was performed with intravenous contrast administration. Multiplanar CT image reconstructions were also generated.  Contrast: OMNIPAQUE IOHEXOL 300 MG/ML  SOLN  Comparison: MRI of the lumbar spine performed 01/29/2011  Findings: There is no evidence of fracture or subluxation along the lumbar spine.  The patient's posterior lumbar spinal fusion at L4- S1 appears intact, without evidence of loosening or changes in hardware position.  Intervertebral spacers appear intact.  There is no definite evidence of an epidural fluid collection, though evaluation for abscess is suboptimal due to metallic artifact.  There is no evidence of an overlying soft tissue abscess at the site of surgery.  Mild soft tissue edema at the site of surgery remains within normal limits; there is mild overlying skin thickening.  The neural foramina are not well assessed on CT.  However, the foraminal fat appears grossly preserved bilaterally along the visualized lower thoracic and lumbar spine.  The sacrum and coccyx are grossly unremarkable in appearance.  No significant small soft tissue abnormalities are identified. Visualized small and large bowel loops are grossly  unremarkable in appearance.  The visualized portions of the kidneys are grossly unremarkable.  The bladder is mildly distended and is grossly unremarkable in appearance.  The uterus is within normal limits. The ovaries are relatively symmetric; no suspicious adnexal masses are seen.  IMPRESSION:  1.  No evidence of fracture or subluxation along the lumbar spine; posterior lumbar spinal fusion at L4-S1 appears intact, without evidence of loosening or changes in hardware position. 2.  No definite evidence for epidural fluid collection, though evaluation for abscess is suboptimal due to metal artifact. 3.  No evidence of overlying soft tissue abscess at the site of surgery; mild soft tissue edema remains within normal limits, with mild overlying skin thickening. 4.  Foraminal fat appears grossly preserved bilaterally along the visualized lower thoracic and lumbar spine.  If there is significant clinical concern for abscess or impression on exiting nerve roots, MRI could be considered for further evaluation, when and as deemed clinically appropriate.  Original Report Authenticated By: Tonia Ghent, M.D.   2:33 AM Region of abscess corresponds to soft tissue infection seen on CT scan superior to previous surgical changes.       I personally performed the services described in this documentation, which was scribed in my presence.  The recorded information has been reviewed and considered.    Hanley Seamen, MD 07/26/11 [phone]

## 2011-07-26 NOTE — ED Notes (Signed)
Patient transported to CT 

## 2011-07-29 LAB — CULTURE, ROUTINE-ABSCESS

## 2011-07-30 NOTE — ED Notes (Addendum)
+   Abscul Chart sent to EDP office for review

## 2011-07-31 NOTE — ED Notes (Signed)
Chart returned from EDP office. Switch abx from doxy to Bactrim. Due to coagulase negative staph. RX: Bactrim DS 1 PO BID for 5 days. Prescribed by Moshe Cipro PA-C.

## 2011-08-01 NOTE — ED Notes (Signed)
Voicemail message left to call flow manager's office #.

## 2011-08-09 NOTE — ED Notes (Signed)
No response after 3 attempts made to contact patient at [facility]  Address.

## 2011-08-24 NOTE — ED Notes (Signed)
No response after 30 days.Chart

## 2011-09-16 ENCOUNTER — Encounter: Payer: Self-pay | Admitting: Internal Medicine

## 2011-09-16 ENCOUNTER — Ambulatory Visit (INDEPENDENT_AMBULATORY_CARE_PROVIDER_SITE_OTHER): Payer: 59 | Admitting: Internal Medicine

## 2011-09-16 VITALS — BP 118/78 | HR 93 | Temp 98.0°F | Wt 200.0 lb

## 2011-09-16 DIAGNOSIS — R51 Headache: Secondary | ICD-10-CM

## 2011-09-16 DIAGNOSIS — I10 Essential (primary) hypertension: Secondary | ICD-10-CM

## 2011-09-16 DIAGNOSIS — M542 Cervicalgia: Secondary | ICD-10-CM | POA: Insufficient documentation

## 2011-09-16 DIAGNOSIS — F3289 Other specified depressive episodes: Secondary | ICD-10-CM

## 2011-09-16 DIAGNOSIS — Z9889 Other specified postprocedural states: Secondary | ICD-10-CM

## 2011-09-16 DIAGNOSIS — F329 Major depressive disorder, single episode, unspecified: Secondary | ICD-10-CM

## 2011-09-16 DIAGNOSIS — R635 Abnormal weight gain: Secondary | ICD-10-CM

## 2011-09-16 MED ORDER — CITALOPRAM HYDROBROMIDE 20 MG PO TABS: 20.0000 mg | ORAL_TABLET | Freq: Every day | ORAL | Status: DC

## 2011-09-16 MED ORDER — LISINOPRIL-HYDROCHLOROTHIAZIDE 20-12.5 MG PO TABS: 1.0000 | ORAL_TABLET | Freq: Every day | ORAL | Status: DC

## 2011-09-16 MED ORDER — CYCLOBENZAPRINE HCL 10 MG PO TABS: 10.0000 mg | ORAL_TABLET | Freq: Three times a day (TID) | ORAL | Status: AC | PRN

## 2011-09-16 NOTE — Patient Instructions (Addendum)
Increase the bp med formulation. Check bp readings at least 3 x per week .  Record.   Labs in  One month and then rov.  Labs will include thyroid  Test.  The neck  pain seems mechanical to me  . Look at position at work. Muscle relaxant  Is needed. Aleve 2  Twice a day short term.

## 2011-09-16 NOTE — Progress Notes (Signed)
  Subjective:    Patient ID: [name], female    DOB: 12/22/1975, 36 y.o.   MRN: [id number]  HPI Patient comes in today for follow up of  multiple medical problems.  Since her last visit over a year ago she has had lumbar surgery Surgery October and had long  Rehab course and some complications  .Both side rdiation   And   Had  Fusion and 6 joints  Front  approach  12 hours surgery. And had infection.  At surgical site. Back to work 6 weeks ago.  Has to sit differently   BP  :  Some headaches and neck pain  .   ? If Bp  152/96 .  By end of day although better in am  No prolonged cough . advil prn  600 mg as needed.  No fever  .   Gained weight from surgery  Off pred since  April  Every few weeks.  Problem with ha and neck pain for the last week or so no fever vision change vision or fall .  Mood ok on celexa  Dr Billy Coast added wellbutrin  Seems to help   Review of Systems No cp sob bleeding  vision change weakness in arms  Past history family history social history reviewed in the electronic medical record. meds on gaba pentin     Objective:   Physical Exam BP 118/78  Pulse 93  Temp(Src) 98 F (36.7 C) (Oral)  Wt 200 lb (90.719 kg)  SpO2 98%  LMP 09/09/2011  WDWN in and [place] at  tms clear eyes perrl eoms clear op tongue midline. Nares patent.  Neck: Supple without adenopathy or masses or bruits tender paracervical area  Chest:  Clear to A&P without wheezes rales or rhonchi CV:  S1-S2 no gallops or murmurs peripheral perfusion is normal Abdomen:  Sof,t normal bowel sounds without hepatosplenomegaly, no guarding rebound or masses no CVA tenderness No clubbing cyanosis or edema No upper extr weakness Data review record  Labs earlier in year. Lab Results  Component Value Date   WBC 9.3 07/26/2011   HGB 12.2 07/26/2011   HCT 36.5 07/26/2011   PLT 342 07/26/2011   GLUCOSE 91 07/26/2011   ALT 19 01/27/2011   AST 19 01/27/2011   NA 138 07/26/2011   K 4.2 07/26/2011   CL 101  07/26/2011   CREATININE 0.70 07/26/2011   BUN 13 07/26/2011   CO2 26 07/26/2011   INR 0.92 01/27/2011   Wt Readings from Last 3 Encounters:  09/16/11 200 lb (90.719 kg)  07/25/11 175 lb (79.379 kg)  11/04/10 175 lb (79.379 kg)        Assessment & Plan:  HT   apparently up in the afternoon  Could be interfering or pain  Can inc to 20 .12.5  Follow  Neck pain  NEW  prob ms poss c spine and positional change at work  Look at work station . aleve 2 bid for now with care and flexeril at night if needed.  Mood stable stay on meds.  DJD spine  Sp surgery Weight  Gain prob from above   Intensify lifestyle interventions. contributing  Plan follow up and labs previsit to include tsh

## 2011-09-22 IMAGING — CT CT L SPINE W/ CM
4 of 8 series · 15 of 33 positions shown, 16 images · non-contrast
Comparison: none

CLINICAL DATA: Axial back pain.  Degenerative disc disease at the
lowest fully formed level.  This was described as L5-S1 on previous
MRI 09/14/2010
TECHNIQUE: Multidetector CT imaging of the lumbar spine was
performed without intravenous contrast administration.  Multiplanar
CT image reconstructions were also generated.

[Series 2: l spine bone · axial · 0.27mm/px · z∈[-208,-110]mm · 4 of 67 slices shown, 5 images]
[im 14/67  soft-tissue]
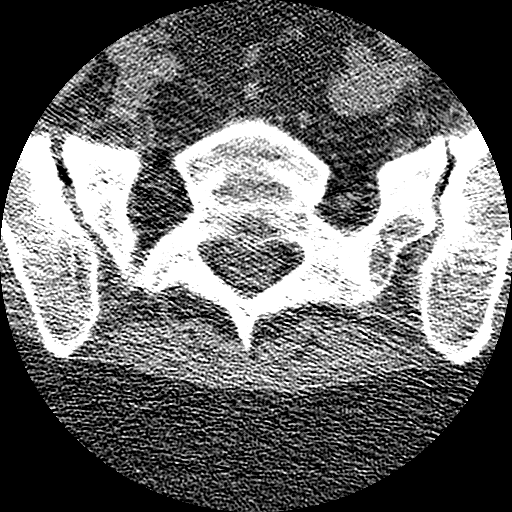
[im 14/67  bone]
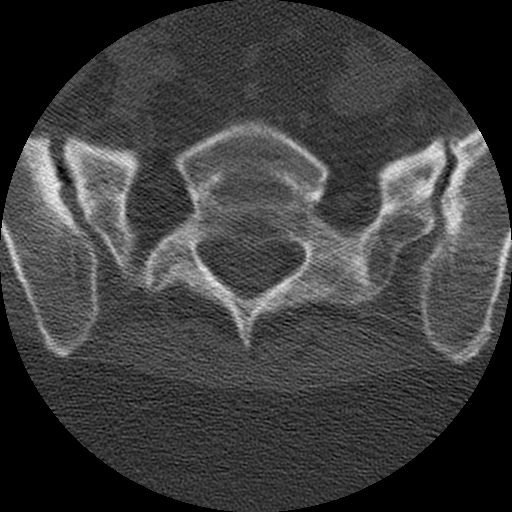
[im 27/67  bone]
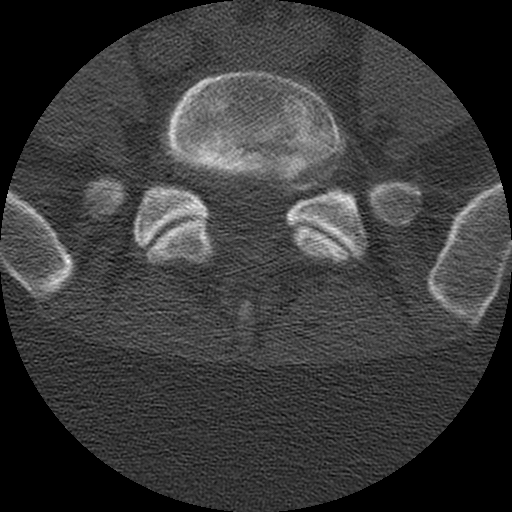
[im 40/67  bone]
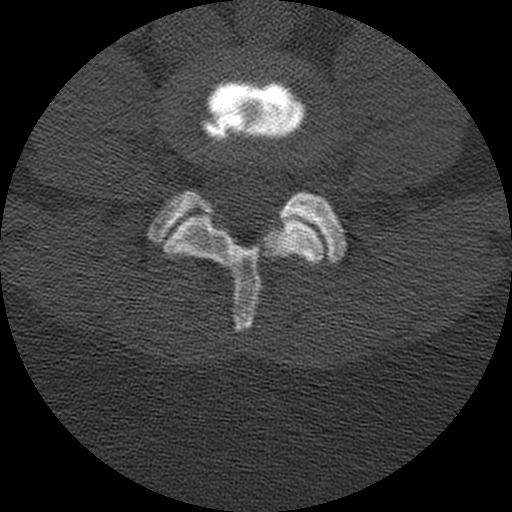
[im 53/67  bone]
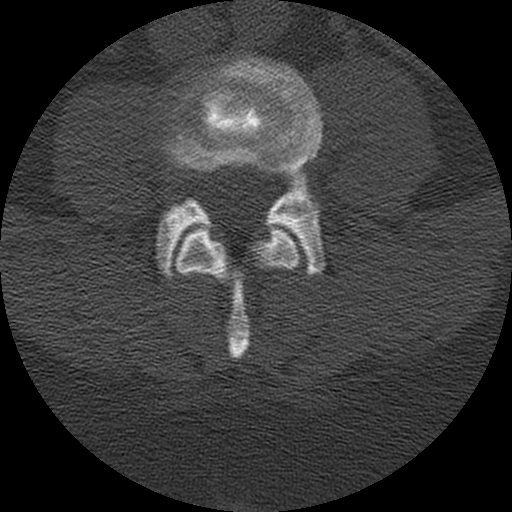

[Series 3: l spine soft · axial · 0.27mm/px · z∈[-200,-118]mm · 3 of 67 slices shown]
[im 17/67  soft-tissue]
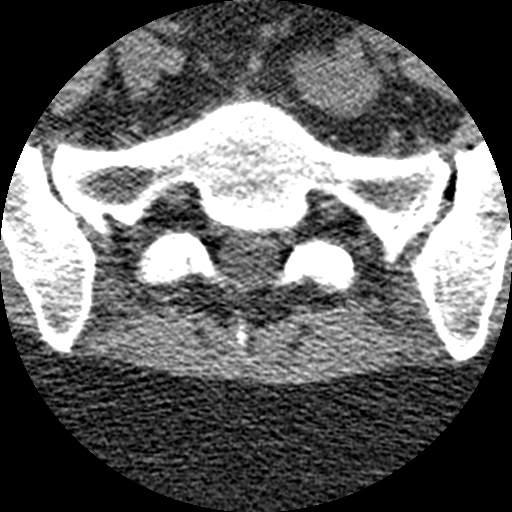
[im 34/67  soft-tissue]
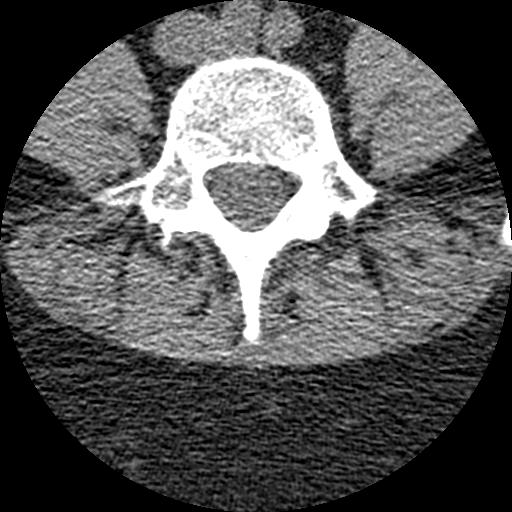
[im 50/67  soft-tissue]
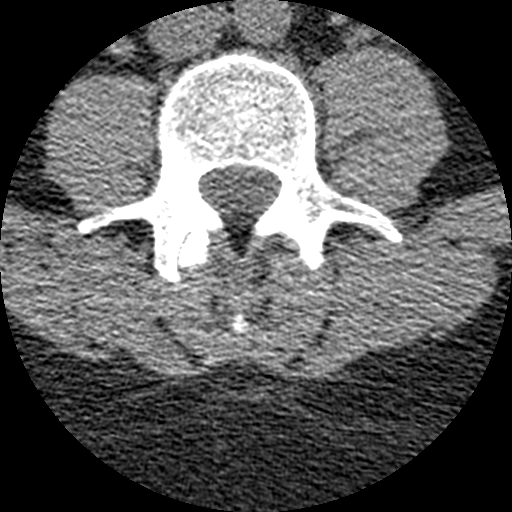

[Series 400: cor · coronal · 0.33mm/px · 5 of 44 slices shown (1 of 2)]
[im 8/44  bone]
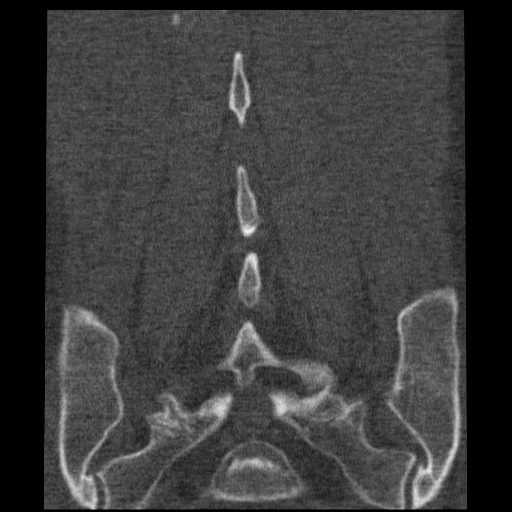
[im 15/44  bone]
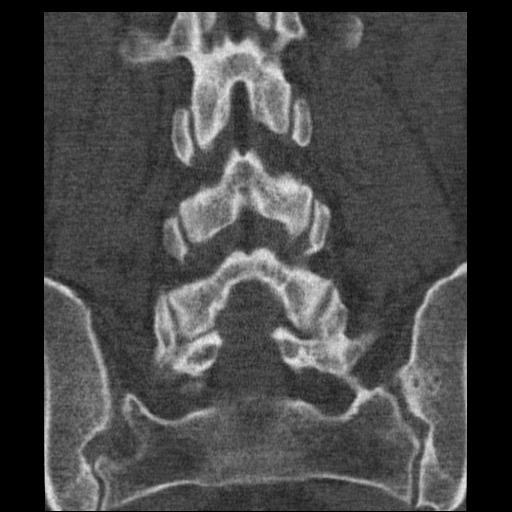
[im 22/44  bone]
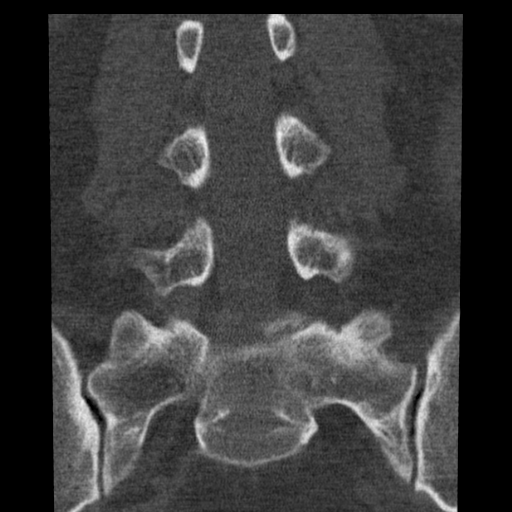
[im 29/44  bone]
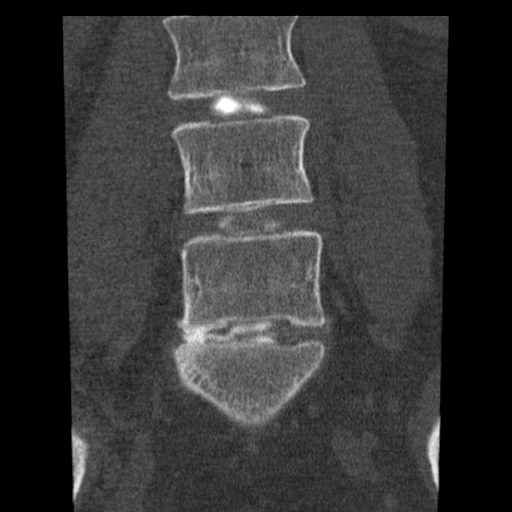
[im 36/44  bone]
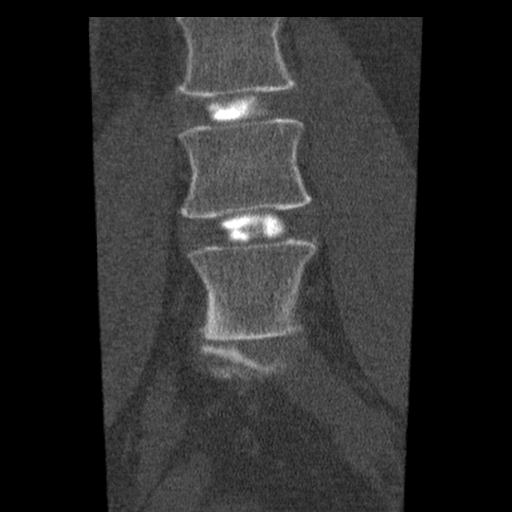

[Series 401: cor · coronal · 0.33mm/px · 3 of 44 slices shown (2 of 2)]
[im 9/44  bone]
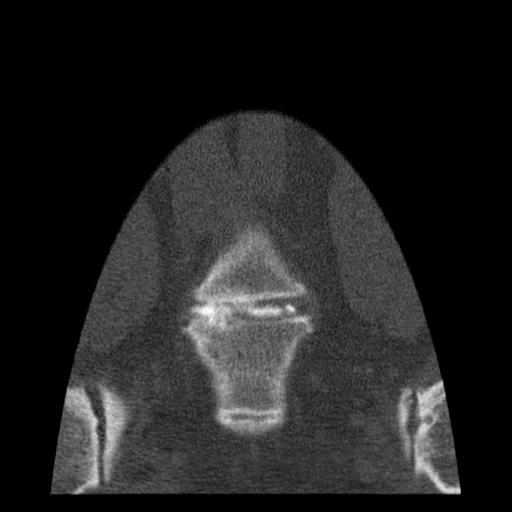
[im 18/44  bone]
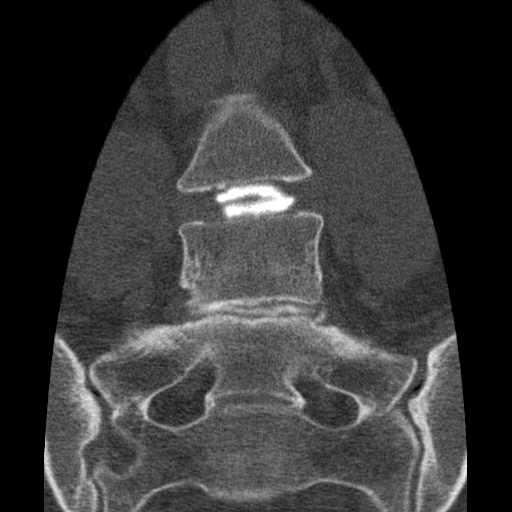
[im 26/44  bone]
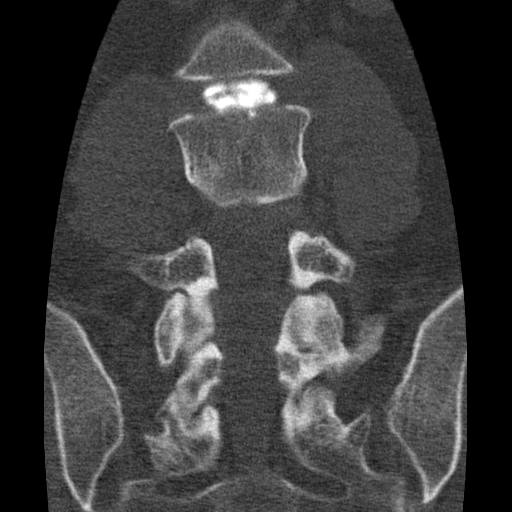

[15 of 33 positions shown; findings below may reference images not displayed]

Fluoroscopy Time: 3 minutes 54 seconds

DIAGNOSTIC LUMBAR DISK INJECTIONS

The procedure was discussed in depth with the patient including the
potential risk of infection. She received 1 gram of cefazolin
intravenously prior to the procedure with a small amount withdrawn
and added to the contrast for injection.  She received one mg of
Versed intravenously prior to the procedure.  She was placed prone
on the fluoroscopic table.  A Betadine scrub of the low back was
performed and the patient was draped in a sterile fashion.  Skin
anesthesia was carried [DATE]% Lidocaine. Using a right sided
oblique approach, 15 cm 22 gauge Tshephoyaone Jakes were directed into
the nuclear regions of the disks at L3-4, L4-5 and L5-S1.
Omnipaque 180 mixed with cefazolin was injected using a pressure
monitoring syringe.  Spot radiographs were taken. The patient's
symptoms were recorded.  Following the procedure, she hadwas
treated with 30mg. intravenous Toradol and 02mcg. Intravenous
Fentanyl.  She was taken to CT scan in good condition.  She was
observed for an hour after the CT scan and discharged in good
condition.

L3-4:  Opening pressure 30 P S I.  Normal nuclear pattern.  The
patient felt mild back and right hip discomfort but this was low-
level and probably not significant.

L4-5:  Opening pressure of 30 P S I.  Normal nuclear pattern.  The
patient felt mild back and right hip discomfort but this was low-
level and probably not significant.

L5-S1:  Opening pressure was five P S I.  This is a diffusely
disrupted disc.  There was severe concordant low back pain at low
pressure and lobe volume.
IMPRESSION: Definite positive discography at L5-S1.  Reproduction of severe
concordant axial low back pain.  Diffusely disrupted disc.

Normal disc morphology and pressure characteristics at L3-4 and L4-
5.  The patient did describe some discomfort at those levels, but I
think that was probably not significant.

CT LUMBAR SPINE WITHOUT CONTRAST
FINDINGS: L3-4:  Normal nuclear pattern.  Canal and foramina
widely patent.

L4-5:  Normal nuclear pattern.  Canal and foramina widely patent.

L5-S1:  Diffusely degenerated disc with contrast permeating the
disc space.  Extrusion in the left posterolateral to foraminal
direction.
IMPRESSION: Normal appearance at L3-4 and L4-5.

Diffusely disrupted disc at L5-S1 with extrusion in the left
posterolateral to foraminal direction.

## 2011-10-09 ENCOUNTER — Emergency Department (HOSPITAL_COMMUNITY): Payer: 59

## 2011-10-09 ENCOUNTER — Encounter (HOSPITAL_COMMUNITY): Payer: Self-pay | Admitting: Emergency Medicine

## 2011-10-09 ENCOUNTER — Emergency Department (HOSPITAL_COMMUNITY)
Admission: EM | Admit: 2011-10-09 | Discharge: 2011-10-10 | Disposition: A | Discharge status: Home / Self Care | Payer: 59 | Attending: Emergency Medicine | Admitting: Emergency Medicine

## 2011-10-09 DIAGNOSIS — Z79899 Other long term (current) drug therapy: Secondary | ICD-10-CM | POA: Insufficient documentation

## 2011-10-09 DIAGNOSIS — S41119A Laceration without foreign body of unspecified upper arm, initial encounter: Secondary | ICD-10-CM

## 2011-10-09 DIAGNOSIS — S51809A Unspecified open wound of unspecified forearm, initial encounter: Secondary | ICD-10-CM | POA: Insufficient documentation

## 2011-10-09 DIAGNOSIS — S32009A Unspecified fracture of unspecified lumbar vertebra, initial encounter for closed fracture: Secondary | ICD-10-CM

## 2011-10-09 DIAGNOSIS — Z981 Arthrodesis status: Secondary | ICD-10-CM | POA: Insufficient documentation

## 2011-10-09 DIAGNOSIS — R4020 Unspecified coma: Secondary | ICD-10-CM

## 2011-10-09 DIAGNOSIS — R404 Transient alteration of awareness: Secondary | ICD-10-CM | POA: Insufficient documentation

## 2011-10-09 LAB — URINALYSIS, ROUTINE W REFLEX MICROSCOPIC
Bilirubin Urine: NEGATIVE
Nitrite: NEGATIVE
Specific Gravity, Urine: 1.031 — ABNORMAL HIGH (ref 1.005–1.030)
Urobilinogen, UA: 0.2 mg/dL (ref 0.0–1.0)

## 2011-10-09 LAB — CBC WITH DIFFERENTIAL/PLATELET
Basophils Absolute: 0 10*3/uL (ref 0.0–0.1)
Basophils Relative: 0 % (ref 0–1)
MCHC: 33.6 g/dL (ref 30.0–36.0)
Neutro Abs: 8.1 10*3/uL — ABNORMAL HIGH (ref 1.7–7.7)
Neutrophils Relative %: 71 % (ref 43–77)
Platelets: 332 10*3/uL (ref 150–400)
RDW: 13.1 % (ref 11.5–15.5)

## 2011-10-09 LAB — URINE MICROSCOPIC-ADD ON

## 2011-10-09 LAB — BASIC METABOLIC PANEL
BUN: 15 mg/dL (ref 6–23)
GFR calc Af Amer: >90 mL/min (ref >90)
GFR calc non Af Amer: >90 mL/min (ref >90)
Potassium: 4.6 mEq/L (ref 3.5–5.1)
Sodium: 141 mEq/L (ref 135–145)

## 2011-10-09 MED ORDER — HYDROMORPHONE HCL PF 1 MG/ML IJ SOLN
1.0000 mg | Freq: Once | INTRAMUSCULAR | Status: DC
  Filled 2011-10-09 (×2): qty 1

## 2011-10-09 MED ORDER — HYDROMORPHONE HCL PF 1 MG/ML IJ SOLN
1.0000 mg | Freq: Once | INTRAMUSCULAR | Status: AC
  Administered 2011-10-09: 1 mg via INTRAVENOUS
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
## 2011-10-10 MED ORDER — HYDROCODONE-ACETAMINOPHEN 5-325 MG PO TABS: 1.0000 | ORAL_TABLET | ORAL | Status: AC | PRN

## 2011-10-10 MED ORDER — HYDROMORPHONE HCL PF 1 MG/ML IJ SOLN
1.0000 mg | Freq: Once | INTRAMUSCULAR | Status: AC
  Administered 2011-10-10: 1 mg via INTRAVENOUS
  Filled 2011-10-10 (×2): qty 1
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
## 2011-10-10 NOTE — Discharge Instructions (Signed)
Lumbar Fracture  A fracture of a bone is the same as a break in the bone. A fracture in the lumbar area is a break that involves one of many parts that make up the 5 bones of the low back area. This is just above the pelvis.  CAUSES Most of these injuries occur as a result of an accident such as:  A fall.   A car accident.   Recreational activities.   A smaller number occur due to:   Industrial, farm, and aviation accidents.   Gunshot wounds and direct blows to the back.   Parachuting incidents.  Most lumbar fractures affect the "building blocks" or the main portion of the spine known as the "vertebral bodies" (see the image on the right). A smaller number involve breaks to portions of bone that extend to the sides or backward behind the vertebral body. In the elderly, a sudden break can happen without an apparent cause. This is because the bones of the back have become extremely thin and fragile. This condition is known as osteoporosis. SYMPTOMS Patients with lumbar fractures have severe pain even if the actual break is small or limited, and there is no injury to nearby nerves. More severe or complex injuries involving other bones and/or organs may include:   Deformity of the back bones.   Swelling/bruising over the injured area.   Limited ability to move the affected area.   Partial or complete loss of function of the bladder and/or bowels. (This may be due to injury to nearby nerves).   More severe injuries can also cause:   Loss of sensation and/or strength in the legs, feet, and toes.   Paralysis.  DIAGNOSIS In most cases, a lumbar fracture will be suspected by what happened just prior to the onset of back pain. X-rays and special imaging (CT scan and MRI imaging) are used to confirm the diagnosis as well as finding out the type and severity of the break or breaks. These tests guide treatment. But there are times when special imaging cannot be done. For example, MRI cannot  be done if there is an implanted metallic device (such as a pacemaker). In these cases, other tests and imaging are done. If there has been nerve damage, more tests can be done. These include:  Tests of nerve function through muscles (nerve conduction studies and electromyography).   Tests of bladder function (urodynamics).   Tests that focus on defining specific nerve problems before surgery and what improvement has come about after surgery (evoked potentials).  TREATMENT Common injuries may involve a small break off of the main surface of the back bone. Or they may be in the form of a partial flattening or compression of the bone. Hospital care may not be needed for these. Medicine for pain control, special back bracing, and limitations in activity are done first. Physical therapy follows later. Complex breaks, multiple fractures of the spine, or unstable injuries can damage the spinal cord. They may require an operation to remove pressure from the nerves and/or spinal cord and to stabilize the broken pieces of bone. Each individual set of injuries is unique. The surgeon will take into consideration many things when planning the best surgical approach that will give the highest likelihood of a good outcome.  HOME CARE INSTRUCTIONS There is pain and stiffness in the back for weeks after a vertebral fracture. Bed rest, pain medicine, and a slow return to activity are generally recommended. Neck and back braces may be  helpful in reducing pain and increasing mobility. When your pain allows, simple walking will help to begin the process of returning to normal activities. Exercises to improve motion and to strengthen the back may also be useful after the initial pain goes away. This will be guided by your caregiver and the team (nurses, physical therapists, occupational therapists, etc.) involved with your ongoing care. For the elderly, treatment for osteoporosis may be needed to help reduce the risk of  fractures in the future. Arrange for follow-up care as recommended to assure proper long-term care and prevention of further spine injury. The failure to follow-up as recommended could result in permanent injury, disability, and a chronic painful condition. SEEK MEDICAL CARE IF:  Pain is not effectively controlled with medication.   You feel unable to decrease pain medication over time as planned.   Activity level is not improving as planned and/or expected.  SEEK IMMEDIATE MEDICAL CARE IF:  You have increasing pain, vomiting, or are unable to move around at all.   You have numbness, tingling, weakness, or paralysis of any part of your body.   You have loss of normal bowel or bladder control.   You have difficulty breathing, cough, fever, chest or abdominal pain.  Document Released: 07/14/2006 Document Revised: 03/18/2011 Document Reviewed: 03/14/2007 [facility] Patient Information 2012 [place], [place].  RESOURCE GUIDE  Dental Problems  Patients with Medicaid: [facility] [phone] [address]                                           [phone] [address] Phone:  [phone]                                                   Phone:  [phone]  If unable to pay or uninsured, contact:  [facility] or [facility]. to become qualified for the adult dental clinic.  Chronic Pain Problems Contact [facility]  [phone] Patients need to be referred by their primary care doctor.  Insufficient Money for Medicine Contact [employer]:  call "211" or [facility] [phone].  No Primary Care Doctor Call [facility]  [phone] Other agencies that provide inexpensive medical care    [facility]  [phone]    [facility]  [phone]    [facility]  [phone]    [facility]  [phone]    [facility]  [phone]    [facility]   [phone]  Psychological Services [facility]  [phone] [facility]  [phone] [facility]   [phone] (emergency services [phone])  Abuse/Neglect [facility] Child Abuse Hotline 856-763-4503 [facility] Child Abuse Hotline (712)196-3629 (After Hours)  Emergency Shelter [facility] [phone]  Maternity Homes Room at the [place] [facility] [place] [phone] [place] Alert Services [phone]  MRSA Hotline #:   240-391-9736    [facility]  [facility] Dept. [address] [place]                     [address]         [address]  [place]ondell Reveal Phone:  [phone]                                  Phone:  [phone]                   Phone:  [phone]  [facility] Phone:  [phone]  [facility] Child Abuse Hotline 929-340-2078 [phone] (After Hours)

## 2011-10-10 NOTE — ED Notes (Signed)
Patient was accompanied by daughters.  Patient is alert and oriented x4, able to walk to the bathroom on her own. Vitals are charted.  Patient was explained discharge instructions and had no questions.

## 2011-10-13 ENCOUNTER — Ambulatory Visit (INDEPENDENT_AMBULATORY_CARE_PROVIDER_SITE_OTHER): Payer: 59 | Admitting: Emergency Medicine

## 2011-10-13 VITALS — BP 125/76 | HR 97 | Temp 99.5°F | Resp 17 | Ht 65.5 in | Wt 202.0 lb

## 2011-10-13 DIAGNOSIS — H7290 Unspecified perforation of tympanic membrane, unspecified ear: Secondary | ICD-10-CM

## 2011-10-13 MED ORDER — OFLOXACIN 0.3 % OT SOLN: 5.0000 [drp] | Freq: Every day | OTIC | Status: AC
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
## 2011-10-21 ENCOUNTER — Other Ambulatory Visit: Payer: 59

## 2011-10-28 ENCOUNTER — Ambulatory Visit: Payer: 59 | Admitting: Internal Medicine

## 2011-11-30 ENCOUNTER — Other Ambulatory Visit (INDEPENDENT_AMBULATORY_CARE_PROVIDER_SITE_OTHER): Payer: 59

## 2011-11-30 DIAGNOSIS — Z0000 Encounter for general adult medical examination without abnormal findings: Secondary | ICD-10-CM

## 2011-11-30 DIAGNOSIS — Z13220 Encounter for screening for lipoid disorders: Secondary | ICD-10-CM

## 2011-11-30 LAB — LIPID PANEL
Cholesterol: 172 mg/dL (ref 0–200)
HDL: 61 mg/dL (ref >39.00)
Triglycerides: 213 mg/dL — ABNORMAL HIGH (ref 0.0–149.0)

## 2011-11-30 LAB — BASIC METABOLIC PANEL
CO2: 25 mEq/L (ref 19–32)
Calcium: 9 mg/dL (ref 8.4–10.5)
GFR: 99.45 mL/min (ref >60.00)
Sodium: 139 mEq/L (ref 135–145)

## 2011-11-30 LAB — LDL CHOLESTEROL, DIRECT: Direct LDL: 89.6 mg/dL

## 2011-11-30 LAB — T4, FREE: Free T4: 0.79 ng/dL (ref 0.60–1.60)

## 2011-11-30 LAB — HEPATIC FUNCTION PANEL
ALT: 13 U/L (ref 0–35)
Albumin: 3.9 g/dL (ref 3.5–5.2)
Total Bilirubin: 0.7 mg/dL (ref 0.3–1.2)
Total Protein: 6.9 g/dL (ref 6.0–8.3)

## 2011-12-07 ENCOUNTER — Encounter: Payer: Self-pay | Admitting: Internal Medicine

## 2011-12-07 ENCOUNTER — Ambulatory Visit (INDEPENDENT_AMBULATORY_CARE_PROVIDER_SITE_OTHER): Payer: 59 | Admitting: Internal Medicine

## 2011-12-07 VITALS — BP 110/84 | HR 72 | Temp 98.7°F | Wt 198.0 lb

## 2011-12-07 DIAGNOSIS — Z9889 Other specified postprocedural states: Secondary | ICD-10-CM

## 2011-12-07 DIAGNOSIS — R61 Generalized hyperhidrosis: Secondary | ICD-10-CM

## 2011-12-07 DIAGNOSIS — IMO0002 Reserved for concepts with insufficient information to code with codable children: Secondary | ICD-10-CM

## 2011-12-07 DIAGNOSIS — M4850XA Collapsed vertebra, not elsewhere classified, site unspecified, initial encounter for fracture: Secondary | ICD-10-CM

## 2011-12-07 DIAGNOSIS — F329 Major depressive disorder, single episode, unspecified: Secondary | ICD-10-CM

## 2011-12-07 DIAGNOSIS — H7290 Unspecified perforation of tympanic membrane, unspecified ear: Secondary | ICD-10-CM | POA: Insufficient documentation

## 2011-12-07 DIAGNOSIS — F3289 Other specified depressive episodes: Secondary | ICD-10-CM

## 2011-12-07 DIAGNOSIS — IMO0001 Reserved for inherently not codable concepts without codable children: Secondary | ICD-10-CM | POA: Insufficient documentation

## 2011-12-07 DIAGNOSIS — I10 Essential (primary) hypertension: Secondary | ICD-10-CM

## 2011-12-07 HISTORY — DX: Collapsed vertebra, not elsewhere classified, site unspecified, initial encounter for fracture: M48.50XA

## 2011-12-07 HISTORY — DX: Unspecified perforation of tympanic membrane, unspecified ear: H72.90

## 2011-12-07 MED ORDER — LISINOPRIL-HYDROCHLOROTHIAZIDE 20-12.5 MG PO TABS: 1.0000 | ORAL_TABLET | Freq: Every day | ORAL | Status: DC

## 2011-12-07 MED ORDER — CITALOPRAM HYDROBROMIDE 20 MG PO TABS: 20.0000 mg | ORAL_TABLET | Freq: Every day | ORAL | Status: DC
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
## 2011-12-07 NOTE — Patient Instructions (Signed)
  Uncertain why  You have so much sweating  We can stop the wellbutrin for now and see how you are doing after 1-2 weeks  . Call us and  Consider changing to  Cymbalta. From the celexa  20 mg at that time.   Continue same BP meds for now.

## 2012-06-20 ENCOUNTER — Encounter (HOSPITAL_COMMUNITY): Payer: Self-pay | Admitting: Pharmacist

## 2012-06-26 ENCOUNTER — Other Ambulatory Visit: Payer: Self-pay | Admitting: Obstetrics and Gynecology

## 2012-06-29 ENCOUNTER — Encounter (HOSPITAL_COMMUNITY): Payer: Self-pay

## 2012-06-29 ENCOUNTER — Encounter (HOSPITAL_COMMUNITY)
Admission: RE | Admit: 2012-06-29 | Discharge: 2012-06-29 | Disposition: A | Discharge status: Home / Self Care | Payer: 59 | Source: Ambulatory Visit | Attending: Obstetrics and Gynecology | Admitting: Obstetrics and Gynecology

## 2012-06-29 HISTORY — DX: Other specified postprocedural states: R11.2

## 2012-06-29 HISTORY — DX: Other specified postprocedural states: Z98.890

## 2012-06-29 LAB — COMPREHENSIVE METABOLIC PANEL
ALT: 16 U/L (ref 0–35)
AST: 19 U/L (ref 0–37)
Albumin: 3.6 g/dL (ref 3.5–5.2)
Alkaline Phosphatase: 58 U/L (ref 39–117)
Calcium: 9.3 mg/dL (ref 8.4–10.5)
GFR calc Af Amer: >90 mL/min (ref >90)
Glucose, Bld: 104 mg/dL — ABNORMAL HIGH (ref 70–99)
Potassium: 4.4 mEq/L (ref 3.5–5.1)
Sodium: 133 mEq/L — ABNORMAL LOW (ref 135–145)
Total Protein: 6.6 g/dL (ref 6.0–8.3)

## 2012-06-29 LAB — CBC
Hemoglobin: 12.5 g/dL (ref 12.0–15.0)
MCH: 28.4 pg (ref 26.0–34.0)
MCHC: 33 g/dL (ref 30.0–36.0)
Platelets: 358 10*3/uL (ref 150–400)
RDW: 13.4 % (ref 11.5–15.5)

## 2012-06-29 NOTE — Patient Instructions (Addendum)
[address] Starl[address] [place]  06/29/2012   Your procedure is scheduled on:  07/03/12  Enter through the Main Entrance of [facility] at 1130 AM.  Pick up the phone at the desk and dial [phone].   Call this number if you have problems the morning of surgery: [phone]   Remember:   Do not eat food:After Midnight.  Do not drink clear liquids: 6 Hours before arrival.  Take these medicines the morning of surgery with A SIP OF WATER: NA   Do not wear jewelry, make-up or nail polish.  Do not wear lotions, powders, or perfumes. You may wear deodorant.  Do not shave 48 hours prior to surgery.  Do not bring valuables to the hospital.  Contacts, dentures or bridgework may not be worn into surgery.  Leave suitcase in the car. After surgery it may be brought to your room.  For patients admitted to the hospital, checkout time is 11:00 AM the day of discharge.   Patients discharged the day of surgery will not be allowed to drive home.  Name and phone number of your driver: NA  Special Instructions: Shower using CHG 2 nights before surgery and the night before surgery.  If you shower the day of surgery use CHG.  Use special wash - you have one bottle of CHG for all showers.  You should use approximately 1/3 of the bottle for each shower.   Please read over the following fact sheets that you were given: Surgical Site Infection Prevention

## 2012-07-02 NOTE — H&P (Signed)
[name], [name] NO.:  [id number]  MEDICAL RECORD NO.:  [id number]  LOCATION:  SDC                           FACILITY:  [facility]  PHYSICIAN:  Lenoard Aden, M.D.DATE OF BIRTH:  1975-07-07  DATE OF ADMISSION:  06/29/2012 DATE OF DISCHARGE:  06/29/2012                             HISTORY & PHYSICAL   CHIEF COMPLAINT:  Symptomatic fibroids.  HISTORY OF PRESENT ILLNESS:  She is a 37 year old white female, G0, P0, who presents with symptomatic dysmenorrhea, menorrhagia, and fibroids refractory to medical therapy for definitive therapy.  MEDICATIONS:  Celexa, lisinopril, gabapentin, calcium, multivitamin, and Tylenol p.r.n.  ALLERGIES:  She has no known drug allergies.  FAMILY HISTORY:  Ehlers-Danlos syndrome in her daughter, noncontributory.  PREGNANCY HISTORY:  She has a personal history of back surgery.  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  PHYSICAL EXAMINATION:  GENERAL:  A well-developed, well-nourished white female, in no acute distress. HEENT:  Normal. NECK:  Supple.  Full range of motion. LUNGS:  Clear. HEART:  Regular rhythm. ABDOMEN:  Soft, nontender.  Uterus is 8-10 weeks size.  No adnexal masses. EXTREMITIES:  There are no cords. NEUROLOGIC:  Nonfocal. SKIN:  Intact.  IMPRESSION:  Symptomatic fibroids with dysmenorrhea, menorrhagia for definitive therapy.  PLAN:  Proceed with da Vinci assisted total laparoscopic hysterectomy, bilateral salpingectomy.  Risks of anesthesia, infection, bleeding, injury to abdominal organs, need for repair was discussed, delayed versus immediate complications to include bowel and bladder with surgery was noted.  The patient acknowledges and wishes to proceed.     Lenoard Aden, M.D.     RJT/MEDQ  D:  07/02/2012  T:  07/02/2012  Job:  [id number]

## 2012-07-03 ENCOUNTER — Encounter (HOSPITAL_COMMUNITY): Payer: Self-pay | Admitting: Anesthesiology

## 2012-07-03 ENCOUNTER — Ambulatory Visit (HOSPITAL_COMMUNITY): Payer: 59 | Admitting: Anesthesiology

## 2012-07-03 ENCOUNTER — Ambulatory Visit (HOSPITAL_COMMUNITY)
Admission: RE | Admit: 2012-07-03 | Discharge: 2012-07-04 | Disposition: A | Discharge status: Home / Self Care | Payer: 59 | Source: Ambulatory Visit | Attending: Obstetrics and Gynecology | Admitting: Obstetrics and Gynecology

## 2012-07-03 ENCOUNTER — Encounter (HOSPITAL_COMMUNITY)
Admission: RE | Disposition: A | Discharge status: Home / Self Care | Payer: Self-pay | Source: Ambulatory Visit | Attending: Obstetrics and Gynecology

## 2012-07-03 DIAGNOSIS — D250 Submucous leiomyoma of uterus: Secondary | ICD-10-CM | POA: Insufficient documentation

## 2012-07-03 DIAGNOSIS — N946 Dysmenorrhea, unspecified: Secondary | ICD-10-CM | POA: Insufficient documentation

## 2012-07-03 DIAGNOSIS — N815 Vaginal enterocele: Secondary | ICD-10-CM | POA: Insufficient documentation

## 2012-07-03 HISTORY — PX: ROBOTIC ASSISTED TOTAL HYSTERECTOMY: SHX6085

## 2012-07-03 HISTORY — PX: BILATERAL SALPINGECTOMY: SHX5743

## 2012-07-03 LAB — HCG, SERUM, QUALITATIVE: Preg, Serum: NEGATIVE

## 2012-07-03 SURGERY — ROBOTIC ASSISTED TOTAL HYSTERECTOMY
Anesthesia: General | Site: Abdomen | Wound class: Clean Contaminated

## 2012-07-03 MED ORDER — FENTANYL CITRATE 0.05 MG/ML IJ SOLN
INTRAMUSCULAR | Status: DC | PRN
  Administered 2012-07-03 (×3): 50 ug via INTRAVENOUS
  Administered 2012-07-03: 100 ug via INTRAVENOUS

## 2012-07-03 MED ORDER — GABAPENTIN 300 MG PO CAPS
1200.0000 mg | ORAL_CAPSULE | Freq: Every day | ORAL | Status: DC
  Administered 2012-07-03: 1200 mg via ORAL
  Filled 2012-07-03: qty 4

## 2012-07-03 MED ORDER — SCOPOLAMINE 1 MG/3DAYS TD PT72
1.0000 | MEDICATED_PATCH | TRANSDERMAL | Status: DC
  Administered 2012-07-03: 1.5 mg via TRANSDERMAL

## 2012-07-03 MED ORDER — OXYCODONE-ACETAMINOPHEN 5-325 MG PO TABS
1.0000 | ORAL_TABLET | ORAL | Status: DC | PRN
  Administered 2012-07-04: 2 via ORAL
  Filled 2012-07-03: qty 2

## 2012-07-03 MED ORDER — ROPIVACAINE HCL 5 MG/ML IJ SOLN
INTRAMUSCULAR | Status: DC | PRN
  Administered 2012-07-03: 80 mL

## 2012-07-03 MED ORDER — MIDAZOLAM HCL 2 MG/2ML IJ SOLN
INTRAMUSCULAR | Status: AC
  Filled 2012-07-03: qty 2

## 2012-07-03 MED ORDER — SODIUM CHLORIDE 0.9 % IJ SOLN: 9.0000 mL | INTRAMUSCULAR | Status: DC | PRN

## 2012-07-03 MED ORDER — PROPOFOL 10 MG/ML IV EMUL
INTRAVENOUS | Status: DC | PRN
  Administered 2012-07-03: 150 mg via INTRAVENOUS

## 2012-07-03 MED ORDER — MEPERIDINE HCL 25 MG/ML IJ SOLN: 6.2500 mg | INTRAMUSCULAR | Status: DC | PRN

## 2012-07-03 MED ORDER — ROPIVACAINE HCL 5 MG/ML IJ SOLN
INTRAMUSCULAR | Status: AC
  Filled 2012-07-03: qty 60

## 2012-07-03 MED ORDER — PROMETHAZINE HCL 25 MG/ML IJ SOLN: 6.2500 mg | INTRAMUSCULAR | Status: DC | PRN

## 2012-07-03 MED ORDER — NALOXONE HCL 0.4 MG/ML IJ SOLN: 0.4000 mg | INTRAMUSCULAR | Status: DC | PRN

## 2012-07-03 MED ORDER — FENTANYL CITRATE 0.05 MG/ML IJ SOLN
INTRAMUSCULAR | Status: AC
  Administered 2012-07-03: 50 ug via INTRAVENOUS
  Filled 2012-07-03: qty 2

## 2012-07-03 MED ORDER — KETOROLAC TROMETHAMINE 30 MG/ML IJ SOLN
30.0000 mg | Freq: Four times a day (QID) | INTRAMUSCULAR | Status: DC | PRN
  Administered 2012-07-03 – 2012-07-04 (×2): 30 mg via INTRAVENOUS
  Filled 2012-07-03 (×2): qty 1

## 2012-07-03 MED ORDER — MIDAZOLAM HCL 5 MG/5ML IJ SOLN
INTRAMUSCULAR | Status: DC | PRN
  Administered 2012-07-03: 2 mg via INTRAVENOUS

## 2012-07-03 MED ORDER — DIPHENHYDRAMINE HCL 50 MG/ML IJ SOLN
12.5000 mg | Freq: Four times a day (QID) | INTRAMUSCULAR | Status: DC | PRN
  Administered 2012-07-03: 16:00:00 via INTRAVENOUS
  Filled 2012-07-03: qty 1

## 2012-07-03 MED ORDER — SCOPOLAMINE 1 MG/3DAYS TD PT72
MEDICATED_PATCH | TRANSDERMAL | Status: AC
  Filled 2012-07-03: qty 1

## 2012-07-03 MED ORDER — CEFAZOLIN SODIUM-DEXTROSE 2-3 GM-% IV SOLR
INTRAVENOUS | Status: AC
  Filled 2012-07-03: qty 50

## 2012-07-03 MED ORDER — CITALOPRAM HYDROBROMIDE 40 MG PO TABS
40.0000 mg | ORAL_TABLET | Freq: Every day | ORAL | Status: DC
  Administered 2012-07-03: 40 mg via ORAL
  Filled 2012-07-03: qty 1

## 2012-07-03 MED ORDER — FENTANYL CITRATE 0.05 MG/ML IJ SOLN
INTRAMUSCULAR | Status: AC
  Filled 2012-07-03: qty 2

## 2012-07-03 MED ORDER — CEFAZOLIN SODIUM-DEXTROSE 2-3 GM-% IV SOLR
2.0000 g | INTRAVENOUS | Status: AC
  Administered 2012-07-03: 2 g via INTRAVENOUS

## 2012-07-03 MED ORDER — MIDAZOLAM HCL 2 MG/2ML IJ SOLN: 0.5000 mg | Freq: Once | INTRAMUSCULAR | Status: DC | PRN

## 2012-07-03 MED ORDER — ROCURONIUM BROMIDE 100 MG/10ML IV SOLN
INTRAVENOUS | Status: DC | PRN
  Administered 2012-07-03: 50 mg via INTRAVENOUS
  Administered 2012-07-03: 20 mg via INTRAVENOUS

## 2012-07-03 MED ORDER — HYDROMORPHONE 0.3 MG/ML IV SOLN
INTRAVENOUS | Status: DC
  Administered 2012-07-03: 14:00:00 via INTRAVENOUS
  Administered 2012-07-03: 2.79 mg via INTRAVENOUS
  Administered 2012-07-03: 2.19 mg via INTRAVENOUS
  Administered 2012-07-04: 0.599 mg via INTRAVENOUS
  Administered 2012-07-04: 1.19 mg via INTRAVENOUS
  Administered 2012-07-04: 05:00:00 via INTRAVENOUS
  Filled 2012-07-03 (×2): qty 25

## 2012-07-03 MED ORDER — LIDOCAINE HCL (CARDIAC) 20 MG/ML IV SOLN
INTRAVENOUS | Status: DC | PRN
  Administered 2012-07-03: 100 mg via INTRAVENOUS

## 2012-07-03 MED ORDER — LACTATED RINGERS IR SOLN
Status: DC | PRN
  Administered 2012-07-03: 3000 mL

## 2012-07-03 MED ORDER — TRAMADOL HCL 50 MG PO TABS: 50.0000 mg | ORAL_TABLET | Freq: Four times a day (QID) | ORAL | Status: DC | PRN

## 2012-07-03 MED ORDER — ZOLPIDEM TARTRATE 5 MG PO TABS: 5.0000 mg | ORAL_TABLET | Freq: Every evening | ORAL | Status: DC | PRN

## 2012-07-03 MED ORDER — LACTATED RINGERS IV SOLN
INTRAVENOUS | Status: DC
  Administered 2012-07-03 (×2): via INTRAVENOUS

## 2012-07-03 MED ORDER — FENTANYL CITRATE 0.05 MG/ML IJ SOLN
25.0000 ug | INTRAMUSCULAR | Status: DC | PRN
  Administered 2012-07-03: 50 ug via INTRAVENOUS
  Administered 2012-07-03 (×2): 25 ug via INTRAVENOUS

## 2012-07-03 MED ORDER — LISINOPRIL 20 MG PO TABS
20.0000 mg | ORAL_TABLET | Freq: Every day | ORAL | Status: DC
  Administered 2012-07-03: 20 mg via ORAL
  Filled 2012-07-03: qty 1

## 2012-07-03 MED ORDER — KETOROLAC TROMETHAMINE 30 MG/ML IJ SOLN: 15.0000 mg | Freq: Once | INTRAMUSCULAR | Status: DC | PRN

## 2012-07-03 MED ORDER — LISINOPRIL-HYDROCHLOROTHIAZIDE 20-12.5 MG PO TABS: 1.0000 | ORAL_TABLET | Freq: Every day | ORAL | Status: DC

## 2012-07-03 MED ORDER — DIPHENHYDRAMINE HCL 12.5 MG/5ML PO ELIX
12.5000 mg | ORAL_SOLUTION | Freq: Four times a day (QID) | ORAL | Status: DC | PRN
  Administered 2012-07-03: 12.5 mg via ORAL
  Filled 2012-07-03: qty 5

## 2012-07-03 MED ORDER — ONDANSETRON HCL 4 MG/2ML IJ SOLN: 4.0000 mg | Freq: Four times a day (QID) | INTRAMUSCULAR | Status: DC | PRN

## 2012-07-03 MED ORDER — ACETAMINOPHEN 10 MG/ML IV SOLN
INTRAVENOUS | Status: AC
  Administered 2012-07-03: 1000 mg via INTRAVENOUS
  Filled 2012-07-03: qty 100

## 2012-07-03 MED ORDER — DIPHENHYDRAMINE HCL 25 MG PO CAPS
25.0000 mg | ORAL_CAPSULE | Freq: Four times a day (QID) | ORAL | Status: DC | PRN
  Administered 2012-07-03: 25 mg via ORAL
  Filled 2012-07-03: qty 1

## 2012-07-03 MED ORDER — DEXTROSE IN LACTATED RINGERS 5 % IV SOLN
INTRAVENOUS | Status: DC
  Administered 2012-07-03 – 2012-07-04 (×3): via INTRAVENOUS

## 2012-07-03 MED ORDER — HYDROCHLOROTHIAZIDE 12.5 MG PO CAPS
12.5000 mg | ORAL_CAPSULE | Freq: Every day | ORAL | Status: DC
  Administered 2012-07-03: 12.5 mg via ORAL
  Filled 2012-07-03: qty 1

## 2012-07-03 MED ORDER — ACETAMINOPHEN 10 MG/ML IV SOLN: 1000.0000 mg | Freq: Once | INTRAVENOUS | Status: DC

## 2012-07-03 MED ORDER — GABAPENTIN 600 MG PO TABS
1200.0000 mg | ORAL_TABLET | Freq: Every day | ORAL | Status: DC
  Filled 2012-07-03: qty 2

## 2012-07-03 MED ORDER — BUPIVACAINE HCL (PF) 0.25 % IJ SOLN
INTRAMUSCULAR | Status: DC | PRN
  Administered 2012-07-03: 12 mL

## 2012-07-03 MED ORDER — BUPIVACAINE HCL (PF) 0.25 % IJ SOLN
INTRAMUSCULAR | Status: AC
  Filled 2012-07-03: qty 30

## 2012-07-03 MED ORDER — FENTANYL CITRATE 0.05 MG/ML IJ SOLN
INTRAMUSCULAR | Status: AC
  Filled 2012-07-03: qty 5

## 2012-07-03 SURGICAL SUPPLY — 62 items
BAG URINE DRAINAGE (UROLOGICAL SUPPLIES) ×3 IMPLANT
BARRIER ADHS 3X4 INTERCEED (GAUZE/BANDAGES/DRESSINGS) IMPLANT
CATH FOLEY 3WAY  5CC 16FR (CATHETERS) ×1
CATH FOLEY 3WAY 5CC 16FR (CATHETERS) ×2 IMPLANT
CHLORAPREP W/TINT 26ML (MISCELLANEOUS) ×3 IMPLANT
CLOTH BEACON ORANGE TIMEOUT ST (SAFETY) ×3 IMPLANT
CONT PATH 16OZ SNAP LID 3702 (MISCELLANEOUS) ×3 IMPLANT
COVER MAYO STAND STRL (DRAPES) ×3 IMPLANT
COVER TABLE BACK 60X90 (DRAPES) ×6 IMPLANT
COVER TIP SHEARS 8 DVNC (MISCELLANEOUS) ×2 IMPLANT
COVER TIP SHEARS 8MM DA VINCI (MISCELLANEOUS) ×1
DECANTER SPIKE VIAL GLASS SM (MISCELLANEOUS) ×15 IMPLANT
DERMABOND ADVANCED (GAUZE/BANDAGES/DRESSINGS) ×1
DERMABOND ADVANCED .7 DNX12 (GAUZE/BANDAGES/DRESSINGS) ×2 IMPLANT
DRAPE HUG U DISPOSABLE (DRAPE) ×3 IMPLANT
DRAPE LG THREE QUARTER DISP (DRAPES) ×6 IMPLANT
DRAPE WARM FLUID 44X44 (DRAPE) ×3 IMPLANT
ELECT REM PT RETURN 9FT ADLT (ELECTROSURGICAL) ×3
ELECTRODE REM PT RTRN 9FT ADLT (ELECTROSURGICAL) ×2 IMPLANT
EVACUATOR SMOKE 8.L (FILTER) ×3 IMPLANT
GAUZE VASELINE 3X9 (GAUZE/BANDAGES/DRESSINGS) IMPLANT
GLOVE BIO SURGEON STRL SZ7.5 (GLOVE) ×6 IMPLANT
GOWN STRL REIN XL XLG (GOWN DISPOSABLE) ×21 IMPLANT
GYRUS RUMI II 2.5CM BLUE (DISPOSABLE)
GYRUS RUMI II 3.5CM BLUE (DISPOSABLE)
GYRUS RUMI II 4.0CM BLUE (DISPOSABLE)
KIT ACCESSORY DA VINCI DISP (KITS) ×1
KIT ACCESSORY DVNC DISP (KITS) ×2 IMPLANT
LEGGING LITHOTOMY PAIR STRL (DRAPES) ×3 IMPLANT
NEEDLE INSUFFLATION 120MM (ENDOMECHANICALS) ×3 IMPLANT
PACK LAVH (CUSTOM PROCEDURE TRAY) ×3 IMPLANT
PAD PREP 24X48 CUFFED NSTRL (MISCELLANEOUS) ×6 IMPLANT
PLUG CATH AND CAP STER (CATHETERS) ×3 IMPLANT
PROTECTOR NERVE ULNAR (MISCELLANEOUS) ×6 IMPLANT
RUMI II 3.0CM BLUE KOH-EFFICIE (DISPOSABLE) ×3 IMPLANT
RUMI II GYRUS 2.5CM BLUE (DISPOSABLE) IMPLANT
RUMI II GYRUS 3.5CM BLUE (DISPOSABLE) IMPLANT
RUMI II GYRUS 4.0CM BLUE (DISPOSABLE) IMPLANT
SET CYSTO W/LG BORE CLAMP LF (SET/KITS/TRAYS/PACK) IMPLANT
SET IRRIG TUBING LAPAROSCOPIC (IRRIGATION / IRRIGATOR) ×3 IMPLANT
SOLUTION ELECTROLUBE (MISCELLANEOUS) ×3 IMPLANT
SUT VIC AB 0 CT1 27 (SUTURE) ×2
SUT VIC AB 0 CT1 27XBRD ANBCTR (SUTURE) ×4 IMPLANT
SUT VIC AB 0 CT1 27XBRD ANTBC (SUTURE) IMPLANT
SUT VICRYL 0 UR6 27IN ABS (SUTURE) ×3 IMPLANT
SUT VICRYL RAPIDE 4/0 PS 2 (SUTURE) ×6 IMPLANT
SUT VLOC 180 0 9IN  GS21 (SUTURE) ×1
SUT VLOC 180 0 9IN GS21 (SUTURE) ×2 IMPLANT
SYR 50ML LL SCALE MARK (SYRINGE) ×3 IMPLANT
SYRINGE 10CC LL (SYRINGE) ×3 IMPLANT
SYSTEM CONVERTIBLE TROCAR (TROCAR) IMPLANT
TIP UTERINE 6.7X8CM BLUE DISP (MISCELLANEOUS) ×3 IMPLANT
TOWEL OR 17X24 6PK STRL BLUE (TOWEL DISPOSABLE) ×9 IMPLANT
TROCAR BLADELESS OPT 12M 100M (ENDOMECHANICALS) IMPLANT
TROCAR DILATING TIP 12MM 150MM (ENDOMECHANICALS) ×3 IMPLANT
TROCAR DISP BLADELESS 8 DVNC (TROCAR) ×2 IMPLANT
TROCAR DISP BLADELESS 8MM (TROCAR) ×1
TROCAR XCEL 12X100 BLDLESS (ENDOMECHANICALS) IMPLANT
TROCAR XCEL NON-BLD 5MMX100MML (ENDOMECHANICALS) ×3 IMPLANT
TUBING FILTER THERMOFLATOR (ELECTROSURGICAL) ×3 IMPLANT
WARMER LAPAROSCOPE (MISCELLANEOUS) ×3 IMPLANT
WATER STERILE IRR 1000ML POUR (IV SOLUTION) ×9 IMPLANT

## 2012-07-03 NOTE — Anesthesia Postprocedure Evaluation (Signed)
  Anesthesia Post-op Note  Anesthesia Post Note  Patient: [name]  Procedure(s) Performed: Procedure(s) (LRB): ROBOTIC ASSISTED TOTAL HYSTERECTOMY (N/A) BILATERAL SALPINGECTOMY (Bilateral)  Anesthesia type: General  Patient location: PACU  Post pain: Pain level controlled  Post assessment: Post-op Vital signs reviewed  Last Vitals:  Filed Vitals:   07/03/12 1356  BP:   Pulse: 92  Temp: 36.9 C  Resp: 16    Post vital signs: Reviewed  Level of consciousness: sedated  Complications: No apparent anesthesia complications

## 2012-07-03 NOTE — Transfer of Care (Signed)
Immediate Anesthesia Transfer of Care Note  Patient: [name]  Procedure(s) Performed: Procedure(s): ROBOTIC ASSISTED TOTAL HYSTERECTOMY (N/A) BILATERAL SALPINGECTOMY (Bilateral)  Patient Location: PACU  Anesthesia Type:General  Level of Consciousness: awake, alert  and oriented  Airway & Oxygen Therapy: Patient Spontanous Breathing and Patient connected to nasal cannula oxygen  Post-op Assessment: Report given to PACU RN and Post -op Vital signs reviewed and stable  Post vital signs: Reviewed and stable  Complications: No apparent anesthesia complications
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
## 2012-07-03 NOTE — Op Note (Signed)
NAMESUMMERLYNN, GLAUSER NO.:  [id number]  MEDICAL RECORD NO.:  [id number]  LOCATION:  9318                          FACILITY:  [facility]  PHYSICIAN:  Lenoard Aden, M.D.DATE OF BIRTH:  [birth year]  DATE OF PROCEDURE: DATE OF DISCHARGE:                              OPERATIVE REPORT   DESCRIPTION OF PROCEDURE:  After being apprised of the risks of anesthesia, infection, bleeding, injury to abdominal organs, possible need for repair, delayed versus immediate complications to include bowel and bladder injury, possible need for repair, the patient was brought to the operating room where she was administered general anesthetic without complications, prepped and draped in usual sterile fashion.  Foley catheter was placed.  RUMI retractor was placed.  Feet were placed in Yellofin stirrups.  Exam under anesthesia revealed a bulky anteflexed uterus and no adnexal masses.  At this time, a previously noted supraumbilical incision was noted, so a repeat incision was made here. Veress needle was placed, opening pressure -2, 4.5 L of CO2 insufflated without difficulty.  Trocar was placed atraumatically.  Pictures were taken.  Normal liver gallbladder bed, normal appendiceal area.  There was evidence of adhesions of the bowel on the left lower quadrant, and into the left cul-de-sac.  Normal anterior cul-de-sac.  Normal right tube and ovary.  Normal left tube and ovary were noted at this time. Robotic trocars were placed, 1 on the left, 1 on the right, and a 5-mm assistant port on the left.  Deep Trendelenburg position was established and the robot was docked in a standard fashion.  At this time, visualization revealed a normal peristalsing ureter on the right.  The adhesions along the left ovarian fossa are lysed sharply without difficulty.  The right tube was traced out to the fimbriated end, and the mesosalpinx was cauterized and divided.  The retroperitoneal space was entered.   The ureter was identified on the medial leaf of the peritoneum.  The tubo-ovarian ligament was cauterized and divided, and the round ligament was cauterized and divided.  The bladder flap was developed sharply, and the uterine vessels were skeletonized on the left.  On the right, the right tube was traced out to the fimbriated end.  The mesosalpinx was traced along its course and divided using electrocautery.  The retroperitoneal space was entered.  The ureters were noted peristalsing on the medial leaf of the peritoneum.  Good hemostasis was noted.  The tubo-ovarian ligament on the right was divided after being cauterized.  The round ligament was divided after being cauterized and the uterine vessels on the right were skeletonized and divided after finishing development of the bladder flap.  The uterine vessels on the left were then further cauterized and divided. The RUMI cup was outlined 360 degrees at the cervical vaginal junction and the starting of the posterior entry is the specimen was circumferentially detached and removed and retracted into the vagina. At this time, irrigation was accomplished.  Hemostasis was assured.  The uterine of the vaginal cuff was closed using a 0 V-Loc suture with modified Richardson sutures placed at both angles.  The McCall culdoplasty suture was placed in the standard fashion using the 0 V-Loc  suture.  At this time, good hemostasis is noted.  Sutures were removed. Irrigation was accomplished.  The robot was undocked, and reinspection reveals hemostatic surgery.  Pictures were taken.  All trocars were removed under direct visualizations.  The incisions were closed with 0 Vicryl, 4-0 Vicryl, and Dermabond.  The vaginal cuff was inspected and found to be hemostatic, as well as secure.  The urine is clear and copious.  The patient tolerated the procedure well.  The ureters were noted to be peristalsing normally bilaterally.  The patient has tolerated  this procedure well and transferred to the recovery in good condition.     Lenoard Aden, M.D.     RJT/MEDQ  D:  07/03/2012  T:  07/03/2012  Job:  [id number]

## 2012-07-03 NOTE — Progress Notes (Signed)
Patient ID: [name], female   DOB: Nov 29, 1975, 37 y.o.   MRN: [id number] Patient seen and examined. Consent witnessed and signed. No changes noted. Update completed.

## 2012-07-03 NOTE — Op Note (Signed)
07/03/2012  12:21 PM  PATIENT:  [name]  37 y.o. female  PRE-OPERATIVE DIAGNOSIS:  Symptomatic Fibroids   Dysmenorrhea, Menorhagia  POST-OPERATIVE DIAGNOSIS:  Symptomatic Fibroids  Enterocele Pelvic adhesions  PROCEDURE:  Procedure(s): ROBOTIC ASSISTED TOTAL HYSTERECTOMY BILATERAL SALPINGECTOMY  SURGEON:  Surgeon(s): Lenoard Aden, MD Genia Del, MD  ASSISTANTS: Seymour Bars, MD   ANESTHESIA:   local and general  ESTIMATED BLOOD LOSS: less than 50cc  DRAINS: Urinary Catheter (Foley)   LOCAL MEDICATIONS USED:  MARCAINE    and Amount: 10 ml  SPECIMEN:  Source of Specimen:  uterus , cervix and bilateral tubes  DISPOSITION OF SPECIMEN:  PATHOLOGY  COUNTS:  YES  DICTATION #: [id number]  PLAN OF CARE: DC home   PATIENT DISPOSITION:  PACU - hemodynamically stable.

## 2012-07-03 NOTE — Anesthesia Postprocedure Evaluation (Signed)
  Anesthesia Post-op Note  Patient: [name]  Procedure(s) Performed: Procedure(s): ROBOTIC ASSISTED TOTAL HYSTERECTOMY (N/A) BILATERAL SALPINGECTOMY (Bilateral)  Patient Location: [facility]  Anesthesia Type:General  Level of Consciousness: awake, alert  and oriented  Airway and Oxygen Therapy: Patient Spontanous Breathing and Patient connected to nasal cannula oxygen  Post-op Pain: mild  Post-op Assessment: Post-op Vital signs reviewed and Patient's Cardiovascular Status Stable  Post-op Vital Signs: Reviewed and stable  Complications: No apparent anesthesia complications

## 2012-07-04 ENCOUNTER — Encounter (HOSPITAL_COMMUNITY): Payer: Self-pay | Admitting: Obstetrics and Gynecology

## 2012-07-04 LAB — BASIC METABOLIC PANEL
CO2: 27 mEq/L (ref 19–32)
Chloride: 95 mEq/L — ABNORMAL LOW (ref 96–112)
GFR calc Af Amer: >90 mL/min (ref >90)
Potassium: 3.8 mEq/L (ref 3.5–5.1)
Sodium: 131 mEq/L — ABNORMAL LOW (ref 135–145)

## 2012-07-04 LAB — CBC
MCV: 87.3 fL (ref 78.0–100.0)
Platelets: 280 10*3/uL (ref 150–400)
RBC: 3.7 MIL/uL — ABNORMAL LOW (ref 3.87–5.11)
RDW: 13.7 % (ref 11.5–15.5)
WBC: 7.8 10*3/uL (ref 4.0–10.5)

## 2012-07-04 MED ORDER — OXYCODONE-ACETAMINOPHEN 5-325 MG PO TABS: 1.0000 | ORAL_TABLET | ORAL | Status: DC | PRN

## 2012-07-04 MED ORDER — TRAMADOL HCL 50 MG PO TABS: 50.0000 mg | ORAL_TABLET | Freq: Four times a day (QID) | ORAL | Status: DC | PRN

## 2012-07-04 NOTE — Progress Notes (Signed)
1 Day Post-Op Procedure(s) (LRB): ROBOTIC ASSISTED TOTAL HYSTERECTOMY (N/A) BILATERAL SALPINGECTOMY (Bilateral)  Subjective: Patient reports nausea, incisional pain, tolerating PO, + flatus and no problems voiding.    Objective: I have reviewed patient's vital signs, intake and output, medications and labs. BP 110/64  Pulse 90  Temp(Src) 98 F (36.7 C) (Oral)  Resp 14  Ht 5\' 5"  (1.651 m)  Wt 86.637 kg (191 lb)  BMI 31.78 kg/m2  SpO2 100%  CBC    Component Value Date/Time   WBC 7.8 07/04/2012 0545   RBC 3.70* 07/04/2012 0545   HGB 10.6* 07/04/2012 0545   HCT 32.3* 07/04/2012 0545   PLT 280 07/04/2012 0545   MCV 87.3 07/04/2012 0545   MCH 28.6 07/04/2012 0545   MCHC 32.8 07/04/2012 0545   RDW 13.7 07/04/2012 0545   LYMPHSABS 2.2 10/09/2011 2129   MONOABS 0.9 10/09/2011 2129   EOSABS 0.1 10/09/2011 2129   BASOSABS 0.0 10/09/2011 2129      General: alert, cooperative and appears stated age Resp: clear to auscultation bilaterally and normal percussion bilaterally Cardio: regular rate and rhythm, S1, S2 normal, no murmur, click, rub or gallop and normal apical impulse GI: soft, non-tender; bowel sounds normal; no masses,  no organomegaly and incision: clean, dry and intact Extremities: extremities normal, atraumatic, no cyanosis or edema and Homans sign is negative, no sign of DVT Vaginal Bleeding: minimal  Assessment: s/p Procedure(s): ROBOTIC ASSISTED TOTAL HYSTERECTOMY (N/A) BILATERAL SALPINGECTOMY (Bilateral): stable, progressing well and tolerating diet  Plan: Advance diet Encourage ambulation Advance to PO medication Discontinue IV fluids Discharge home  LOS: 1 day    Marvin Maenza J 07/04/2012, 7:38 AM

## 2012-07-04 NOTE — Progress Notes (Signed)
Pt discharged to home with significant other.  Condition stable.  Pt ambulated to car with Shea Stakes, SN[facility].  No equipment for home ordered at discharge.

## 2012-12-28 DIAGNOSIS — N62 Hypertrophy of breast: Secondary | ICD-10-CM | POA: Insufficient documentation

## 2013-01-11 ENCOUNTER — Other Ambulatory Visit: Payer: Self-pay | Admitting: Internal Medicine

## 2013-01-19 ENCOUNTER — Encounter: Payer: Self-pay | Admitting: Internal Medicine

## 2013-01-19 ENCOUNTER — Ambulatory Visit (INDEPENDENT_AMBULATORY_CARE_PROVIDER_SITE_OTHER): Payer: 59 | Admitting: Internal Medicine

## 2013-01-19 VITALS — BP 124/84 | HR 77 | Temp 98.1°F | Wt 201.0 lb

## 2013-01-19 DIAGNOSIS — F3289 Other specified depressive episodes: Secondary | ICD-10-CM

## 2013-01-19 DIAGNOSIS — F329 Major depressive disorder, single episode, unspecified: Secondary | ICD-10-CM

## 2013-01-19 DIAGNOSIS — Z79899 Other long term (current) drug therapy: Secondary | ICD-10-CM

## 2013-01-19 DIAGNOSIS — I10 Essential (primary) hypertension: Secondary | ICD-10-CM

## 2013-01-19 DIAGNOSIS — J302 Other seasonal allergic rhinitis: Secondary | ICD-10-CM | POA: Insufficient documentation

## 2013-01-19 DIAGNOSIS — J309 Allergic rhinitis, unspecified: Secondary | ICD-10-CM

## 2013-01-19 DIAGNOSIS — R05 Cough: Secondary | ICD-10-CM

## 2013-01-19 DIAGNOSIS — R059 Cough, unspecified: Secondary | ICD-10-CM

## 2013-01-19 LAB — LIPID PANEL
HDL: 53.9 mg/dL (ref >39.00)
LDL Cholesterol: 79 mg/dL (ref 0–99)
Total CHOL/HDL Ratio: 3
Triglycerides: 186 mg/dL — ABNORMAL HIGH (ref 0.0–149.0)
VLDL: 37.2 mg/dL (ref 0.0–40.0)

## 2013-01-19 LAB — BASIC METABOLIC PANEL
CO2: 26 mEq/L (ref 19–32)
Calcium: 9.2 mg/dL (ref 8.4–10.5)
Chloride: 107 mEq/L (ref 96–112)
Glucose, Bld: 92 mg/dL (ref 70–99)
Potassium: 4.9 mEq/L (ref 3.5–5.1)
Sodium: 142 mEq/L (ref 135–145)

## 2013-01-19 MED ORDER — LISINOPRIL-HYDROCHLOROTHIAZIDE 20-12.5 MG PO TABS: 1.0000 | ORAL_TABLET | Freq: Every day | ORAL | Status: DC

## 2013-01-19 MED ORDER — CITALOPRAM HYDROBROMIDE 40 MG PO TABS: 40.0000 mg | ORAL_TABLET | Freq: Every day | ORAL | Status: DC

## 2013-01-19 NOTE — Patient Instructions (Signed)
Continue lifestyle intervention healthy eating and exercise . Will notify you  of labs when available. If cough is problematic then call about changing BP medication and  Follow up.  Otherwise yearly  Visit
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
## 2013-01-23 ENCOUNTER — Ambulatory Visit (INDEPENDENT_AMBULATORY_CARE_PROVIDER_SITE_OTHER): Payer: 59 | Admitting: Family Medicine

## 2013-01-23 ENCOUNTER — Ambulatory Visit: Payer: 59

## 2013-01-23 VITALS — BP 140/98 | HR 80 | Temp 98.5°F | Resp 18 | Ht 64.5 in | Wt 201.0 lb

## 2013-01-23 DIAGNOSIS — M542 Cervicalgia: Secondary | ICD-10-CM

## 2013-01-23 DIAGNOSIS — E860 Dehydration: Secondary | ICD-10-CM

## 2013-01-23 DIAGNOSIS — R110 Nausea: Secondary | ICD-10-CM

## 2013-01-23 DIAGNOSIS — R079 Chest pain, unspecified: Secondary | ICD-10-CM

## 2013-01-23 DIAGNOSIS — R0602 Shortness of breath: Secondary | ICD-10-CM

## 2013-01-23 DIAGNOSIS — R197 Diarrhea, unspecified: Secondary | ICD-10-CM

## 2013-01-23 DIAGNOSIS — R209 Unspecified disturbances of skin sensation: Secondary | ICD-10-CM

## 2013-01-23 DIAGNOSIS — R202 Paresthesia of skin: Secondary | ICD-10-CM

## 2013-01-23 LAB — POCT CBC
Granulocyte percent: 57.1 %G (ref 37–80)
HCT, POC: 40.4 % (ref 37.7–47.9)
Hemoglobin: 13 g/dL (ref 12.2–16.2)
Lymph, poc: 2.2 (ref 0.6–3.4)
MCH, POC: 29.7 pg (ref 27–31.2)
MCHC: 32.2 g/dL (ref 31.8–35.4)
MCV: 92.4 fL (ref 80–97)
MID (cbc): 0.3 (ref 0–0.9)
MPV: 8.3 fL (ref 0–99.8)
POC Granulocyte: 3.4 (ref 2–6.9)
POC LYMPH PERCENT: 37.1 % (ref 10–50)
POC MID %: 5.8 % (ref 0–12)
Platelet Count, POC: 361 10*3/uL (ref 142–424)
RBC: 4.37 M/uL (ref 4.04–5.48)
RDW, POC: 13.5 %
WBC: 6 10*3/uL (ref 4.6–10.2)

## 2013-01-23 MED ORDER — PROMETHAZINE HCL 25 MG PO TABS: 25.0000 mg | ORAL_TABLET | Freq: Four times a day (QID) | ORAL | Status: DC | PRN

## 2013-01-23 MED ORDER — HYDROCODONE-ACETAMINOPHEN 5-325 MG PO TABS: 1.0000 | ORAL_TABLET | Freq: Four times a day (QID) | ORAL | Status: DC | PRN

## 2013-01-23 MED ORDER — PROMETHAZINE HCL 25 MG/ML IJ SOLN
25.0000 mg | Freq: Once | INTRAMUSCULAR | Status: AC
  Administered 2013-01-23: 25 mg via INTRAMUSCULAR

## 2013-01-23 MED ORDER — METHOCARBAMOL 500 MG PO TABS: 500.0000 mg | ORAL_TABLET | Freq: Two times a day (BID) | ORAL | Status: DC | PRN
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
## 2013-01-23 NOTE — Patient Instructions (Signed)
B.R.A.T. Diet Your doctor has recommended the B.R.A.T. diet for you or your child until the condition improves. This is often used to help control diarrhea and vomiting symptoms. If you or your child can tolerate clear liquids, you may have:  Bananas.   Rice.   Applesauce.   Toast (and other simple starches such as crackers, potatoes, noodles).  Be sure to avoid dairy products, meats, and fatty foods until symptoms are better. Fruit juices such as apple, grape, and prune juice can make diarrhea worse. Avoid these. Continue this diet for 2 days or as instructed by your caregiver. Document Released: 03/29/2005 Document Revised: 03/18/2011 Document Reviewed: 09/15/2006 Exit[employer] Patient Information 2012 [employer]. 

## 2013-01-24 LAB — COMPREHENSIVE METABOLIC PANEL WITH GFR
Alkaline Phosphatase: 68 U/L (ref 39–117)
Creat: 0.7 mg/dL (ref 0.50–1.10)
Glucose, Bld: 88 mg/dL (ref 70–99)
Sodium: 138 meq/L (ref 135–145)
Total Bilirubin: 0.5 mg/dL (ref 0.3–1.2)
Total Protein: 6.6 g/dL (ref 6.0–8.3)

## 2013-01-24 LAB — COMPREHENSIVE METABOLIC PANEL
ALT: 14 U/L (ref 0–35)
AST: 15 U/L (ref 0–37)
Albumin: 4.4 g/dL (ref 3.5–5.2)
BUN: 9 mg/dL (ref 6–23)
CO2: 26 mEq/L (ref 19–32)
Calcium: 9.3 mg/dL (ref 8.4–10.5)
Chloride: 105 mEq/L (ref 96–112)
Potassium: 3.9 mEq/L (ref 3.5–5.3)

## 2013-01-25 ENCOUNTER — Telehealth: Payer: Self-pay | Admitting: Family Medicine

## 2013-01-25 NOTE — Telephone Encounter (Signed)
LM that labs and official chest xray results normal

## 2013-04-12 HISTORY — PX: CHOLECYSTECTOMY: SHX55

## 2014-01-15 ENCOUNTER — Other Ambulatory Visit: Payer: Self-pay

## 2014-01-15 DIAGNOSIS — Z1231 Encounter for screening mammogram for malignant neoplasm of breast: Secondary | ICD-10-CM

## 2014-01-18 ENCOUNTER — Ambulatory Visit
Admission: RE | Admit: 2014-01-18 | Discharge: 2014-01-18 | Disposition: A | Discharge status: Home / Self Care | Payer: BC Managed Care – PPO | Source: Ambulatory Visit

## 2014-01-18 ENCOUNTER — Other Ambulatory Visit: Payer: Self-pay | Admitting: Family Medicine

## 2014-01-18 DIAGNOSIS — Z1231 Encounter for screening mammogram for malignant neoplasm of breast: Secondary | ICD-10-CM

## 2014-01-28 ENCOUNTER — Encounter: Payer: 59 | Admitting: Internal Medicine

## 2014-01-28 ENCOUNTER — Encounter: Payer: Self-pay | Admitting: Internal Medicine

## 2014-01-28 DIAGNOSIS — Z0289 Encounter for other administrative examinations: Secondary | ICD-10-CM

## 2014-01-28 NOTE — Progress Notes (Signed)
Document opened and reviewed for OV but appt  Canceled no show  same day .

## 2014-02-04 ENCOUNTER — Other Ambulatory Visit: Payer: Self-pay | Admitting: Internal Medicine

## 2014-02-04 NOTE — Telephone Encounter (Signed)
Sent to the pharmacy by e-scribe.  Left a message on cell informing the pt that she is now due for yearly wellness and lab work.  Advised she call the office to make both appointments.  Left office number.

## 2014-02-13 ENCOUNTER — Other Ambulatory Visit: Payer: Self-pay | Admitting: Obstetrics and Gynecology

## 2014-02-13 DIAGNOSIS — N644 Mastodynia: Secondary | ICD-10-CM

## 2014-04-09 ENCOUNTER — Emergency Department (HOSPITAL_COMMUNITY)
Admission: EM | Admit: 2014-04-09 | Discharge: 2014-04-09 | Disposition: A | Discharge status: Home / Self Care | Payer: BC Managed Care – PPO | Attending: Emergency Medicine | Admitting: Emergency Medicine

## 2014-04-09 ENCOUNTER — Emergency Department (HOSPITAL_COMMUNITY): Payer: BC Managed Care – PPO

## 2014-04-09 ENCOUNTER — Encounter (HOSPITAL_COMMUNITY): Payer: Self-pay | Admitting: *Deleted

## 2014-04-09 DIAGNOSIS — Y9389 Activity, other specified: Secondary | ICD-10-CM | POA: Diagnosis not present

## 2014-04-09 DIAGNOSIS — Z87828 Personal history of other (healed) physical injury and trauma: Secondary | ICD-10-CM | POA: Diagnosis not present

## 2014-04-09 DIAGNOSIS — Z8603 Personal history of neoplasm of uncertain behavior: Secondary | ICD-10-CM | POA: Insufficient documentation

## 2014-04-09 DIAGNOSIS — F329 Major depressive disorder, single episode, unspecified: Secondary | ICD-10-CM | POA: Diagnosis not present

## 2014-04-09 DIAGNOSIS — Z79899 Other long term (current) drug therapy: Secondary | ICD-10-CM | POA: Diagnosis not present

## 2014-04-09 DIAGNOSIS — T1490XA Injury, unspecified, initial encounter: Secondary | ICD-10-CM

## 2014-04-09 DIAGNOSIS — Z8742 Personal history of other diseases of the female genital tract: Secondary | ICD-10-CM | POA: Diagnosis not present

## 2014-04-09 DIAGNOSIS — Y9289 Other specified places as the place of occurrence of the external cause: Secondary | ICD-10-CM | POA: Insufficient documentation

## 2014-04-09 DIAGNOSIS — Z8781 Personal history of (healed) traumatic fracture: Secondary | ICD-10-CM | POA: Diagnosis not present

## 2014-04-09 DIAGNOSIS — S99912A Unspecified injury of left ankle, initial encounter: Secondary | ICD-10-CM | POA: Diagnosis present

## 2014-04-09 DIAGNOSIS — S9002XA Contusion of left ankle, initial encounter: Secondary | ICD-10-CM | POA: Diagnosis not present

## 2014-04-09 DIAGNOSIS — G43909 Migraine, unspecified, not intractable, without status migrainosus: Secondary | ICD-10-CM | POA: Insufficient documentation

## 2014-04-09 DIAGNOSIS — Z791 Long term (current) use of non-steroidal anti-inflammatories (NSAID): Secondary | ICD-10-CM | POA: Insufficient documentation

## 2014-04-09 DIAGNOSIS — S93402A Sprain of unspecified ligament of left ankle, initial encounter: Secondary | ICD-10-CM | POA: Insufficient documentation

## 2014-04-09 DIAGNOSIS — W108XXA Fall (on) (from) other stairs and steps, initial encounter: Secondary | ICD-10-CM | POA: Insufficient documentation

## 2014-04-09 DIAGNOSIS — I10 Essential (primary) hypertension: Secondary | ICD-10-CM | POA: Insufficient documentation

## 2014-04-09 DIAGNOSIS — Y998 Other external cause status: Secondary | ICD-10-CM | POA: Insufficient documentation

## 2014-04-09 MED ORDER — HYDROCODONE-ACETAMINOPHEN 5-325 MG PO TABS: 1.0000 | ORAL_TABLET | ORAL | Status: DC | PRN
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
## 2014-04-09 NOTE — ED Notes (Signed)
Ice pack provided to patient at this time.

## 2014-04-09 NOTE — Discharge Instructions (Signed)

## 2014-04-09 NOTE — ED Notes (Signed)
Patient verbalizes understanding of discharge instructions, prescription medications, home care and follow up care. Patient ambulatory out of department at this time. 

## 2014-04-09 NOTE — ED Notes (Signed)
Patient states she slipped on wet stairs 4 days ago. Patient states pain to ankle and lower leg. Patient is ambulatory, just states pain is getting worse. Patient has been treating pain with ice, elevation, tylenol, and motrin at home.

## 2014-04-09 NOTE — ED Notes (Signed)
Fell on 12/25, pain lt lower leg, ankle and foot

## 2014-04-12 HISTORY — PX: BREAST ENHANCEMENT SURGERY: SHX7

## 2014-07-26 ENCOUNTER — Encounter: Payer: Self-pay | Admitting: Internal Medicine

## 2014-07-26 ENCOUNTER — Ambulatory Visit (INDEPENDENT_AMBULATORY_CARE_PROVIDER_SITE_OTHER): Payer: BLUE CROSS/BLUE SHIELD | Admitting: Internal Medicine

## 2014-07-26 VITALS — BP 126/76 | Temp 98.0°F | Ht 64.0 in | Wt 207.1 lb

## 2014-07-26 DIAGNOSIS — Z6835 Body mass index (BMI) 35.0-35.9, adult: Secondary | ICD-10-CM | POA: Diagnosis not present

## 2014-07-26 DIAGNOSIS — R635 Abnormal weight gain: Secondary | ICD-10-CM | POA: Diagnosis not present

## 2014-07-26 DIAGNOSIS — Z79899 Other long term (current) drug therapy: Secondary | ICD-10-CM

## 2014-07-26 DIAGNOSIS — I10 Essential (primary) hypertension: Secondary | ICD-10-CM | POA: Diagnosis not present

## 2014-07-26 LAB — LIPID PANEL
CHOL/HDL RATIO: 3
Cholesterol: 169 mg/dL (ref 0–200)
HDL: 50.3 mg/dL (ref >39.00)
NonHDL: 118.7
TRIGLYCERIDES: 383 mg/dL — AB (ref 0.0–149.0)
VLDL: 76.6 mg/dL — AB (ref 0.0–40.0)

## 2014-07-26 LAB — BASIC METABOLIC PANEL
BUN: 12 mg/dL (ref 6–23)
CO2: 28 mEq/L (ref 19–32)
CREATININE: 0.68 mg/dL (ref 0.40–1.20)
Calcium: 9.1 mg/dL (ref 8.4–10.5)
Chloride: 104 mEq/L (ref 96–112)
GFR: 101.44 mL/min (ref >60.00)
Glucose, Bld: 87 mg/dL (ref 70–99)
Potassium: 3.8 mEq/L (ref 3.5–5.1)
SODIUM: 138 meq/L (ref 135–145)

## 2014-07-26 LAB — HEPATIC FUNCTION PANEL
ALT: 10 U/L (ref 0–35)
AST: 12 U/L (ref 0–37)
Albumin: 4.1 g/dL (ref 3.5–5.2)
Alkaline Phosphatase: 73 U/L (ref 39–117)
Bilirubin, Direct: 0.1 mg/dL (ref 0.0–0.3)
TOTAL PROTEIN: 6.5 g/dL (ref 6.0–8.3)
Total Bilirubin: 0.8 mg/dL (ref 0.2–1.2)

## 2014-07-26 LAB — TSH: TSH: 0.88 u[IU]/mL (ref 0.35–4.50)

## 2014-07-26 LAB — CBC WITH DIFFERENTIAL/PLATELET
BASOS PCT: 0.6 % (ref 0.0–3.0)
Basophils Absolute: 0.1 10*3/uL (ref 0.0–0.1)
EOS PCT: 2.8 % (ref 0.0–5.0)
Eosinophils Absolute: 0.2 10*3/uL (ref 0.0–0.7)
HCT: 38.4 % (ref 36.0–46.0)
Hemoglobin: 13 g/dL (ref 12.0–15.0)
LYMPHS ABS: 2.4 10*3/uL (ref 0.7–4.0)
Lymphocytes Relative: 28.7 % (ref 12.0–46.0)
MCHC: 34 g/dL (ref 30.0–36.0)
MCV: 87.3 fl (ref 78.0–100.0)
MONOS PCT: 6.4 % (ref 3.0–12.0)
Monocytes Absolute: 0.5 10*3/uL (ref 0.1–1.0)
NEUTROS ABS: 5.2 10*3/uL (ref 1.4–7.7)
NEUTROS PCT: 61.5 % (ref 43.0–77.0)
PLATELETS: 380 10*3/uL (ref 150.0–400.0)
RBC: 4.4 Mil/uL (ref 3.87–5.11)
RDW: 13.2 % (ref 11.5–15.5)
WBC: 8.4 10*3/uL (ref 4.0–10.5)

## 2014-07-26 LAB — T4, FREE: FREE T4: 0.85 ng/dL (ref 0.60–1.60)

## 2014-07-26 LAB — LDL CHOLESTEROL, DIRECT: Direct LDL: 86 mg/dL

## 2014-07-26 MED ORDER — CITALOPRAM HYDROBROMIDE 40 MG PO TABS: 40.0000 mg | ORAL_TABLET | Freq: Every day | ORAL | Status: AC
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
## 2014-07-26 NOTE — Patient Instructions (Signed)
Tracking   Intake sleep and activity.   Level.  Protein   For breakfast.    Take blood pressure readings twice a day for 10 days- 14  and then periodically .T  .Send in readings      Will decide at that time about further antihypertensive meds.  At that time can decide on beginning  Phentermine as discussed .  Plan ROV 4 weeks after phentermine if started .   Will notify you  of labs when available.     Why follow it? Research shows. . Those who follow the Mediterranean diet have a reduced risk of heart disease  . The diet is associated with a reduced incidence of Parkinson's and Alzheimer's diseases . People following the diet may have longer life expectancies and lower rates of chronic diseases  . The Dietary Guidelines for Americans recommends the Mediterranean diet as an eating plan to promote health and prevent disease  What Is the Mediterranean Diet?  . Healthy eating plan based on typical foods and recipes of Mediterranean-style cooking . The diet is primarily a plant based diet; these foods should make up a majority of meals   Starches - Plant based foods should make up a majority of meals - They are an important sources of vitamins, minerals, energy, antioxidants, and fiber - Choose whole grains, foods high in fiber and minimally processed items  - Typical grain sources include wheat, oats, barley, corn, brown rice, bulgar, farro, millet, polenta, couscous  - Various types of beans include chickpeas, lentils, fava beans, black beans, white beans   Fruits  Veggies - Large quantities of antioxidant rich fruits & veggies; 6 or more servings  - Vegetables can be eaten raw or lightly drizzled with oil and cooked  - Vegetables common to the traditional Mediterranean Diet include: artichokes, arugula, beets, broccoli, brussel sprouts, cabbage, carrots, celery, collard greens, cucumbers, eggplant, kale, leeks, lemons, lettuce, mushrooms, okra, onions, peas, peppers, potatoes, pumpkin,  radishes, rutabaga, shallots, spinach, sweet potatoes, turnips, zucchini - Fruits common to the Mediterranean Diet include: apples, apricots, avocados, cherries, clementines, dates, figs, grapefruits, grapes, melons, nectarines, oranges, peaches, pears, pomegranates, strawberries, tangerines  Fats - Replace butter and margarine with healthy oils, such as olive oil, canola oil, and tahini  - Limit nuts to no more than a handful a day  - Nuts include walnuts, almonds, pecans, pistachios, pine nuts  - Limit or avoid candied, honey roasted or heavily salted nuts - Olives are central to the [employer] - can be eaten whole or used in a variety of dishes   Meats Protein - Limiting red meat: no more than a few times a month - When eating red meat: choose lean cuts and keep the portion to the size of deck of cards - Eggs: approx. 0 to 4 times a week  - Fish and lean poultry: at least 2 a week  - Healthy protein sources include, chicken, [place], lean beef, lamb - Increase intake of seafood such as tuna, salmon, trout, mackerel, shrimp, scallops - Avoid or limit high fat processed meats such as sausage and bacon  Dairy - Include moderate amounts of low fat dairy products  - Focus on healthy dairy such as fat free yogurt, skim milk, low or reduced fat cheese - Limit dairy products higher in fat such as whole or 2% milk, cheese, ice cream  Alcohol - Moderate amounts of red wine is ok  - No more than 5 oz daily for women (all  ages) and men older than age 58  - No more than 10 oz of wine daily for men younger than 6  Other - Limit sweets and other desserts  - Use herbs and spices instead of salt to flavor foods  - Herbs and spices common to the traditional Mediterranean Diet include: basil, bay leaves, chives, cloves, cumin, fennel, garlic, lavender, marjoram, mint, oregano, parsley, pepper, rosemary, sage, savory, sumac, tarragon, thyme   It's not just a diet, it's a lifestyle:  . The  Mediterranean diet includes lifestyle factors typical of those in the region  . Foods, drinks and meals are best eaten with others and savored . Daily physical activity is important for overall good health . This could be strenuous exercise like running and aerobics . This could also be more leisurely activities such as walking, housework, yard-work, or taking the stairs . Moderation is the key; a balanced and healthy diet accommodates most foods and drinks . Consider portion sizes and frequency of consumption of certain foods   Meal Ideas & Options:  . Breakfast:  o Whole wheat toast or whole wheat English muffins with peanut butter & hard boiled egg o Steel cut oats topped with apples & cinnamon and skim milk  o Fresh fruit: banana, strawberries, melon, berries, peaches  o Smoothies: strawberries, bananas, greek yogurt, peanut butter o Low fat greek yogurt with blueberries and granola  o Egg white omelet with spinach and mushrooms o Breakfast couscous: whole wheat couscous, apricots, skim milk, cranberries  . Sandwiches:  o Hummus and grilled vegetables (peppers, zucchini, squash) on whole wheat bread   o Grilled chicken on whole wheat pita with lettuce, tomatoes, cucumbers or tzatziki  o Tuna salad on whole wheat bread: tuna salad made with greek yogurt, olives, red peppers, capers, green onions o Garlic rosemary lamb pita: lamb sauted with garlic, rosemary, salt & pepper; add lettuce, cucumber, greek yogurt to pita - flavor with lemon juice and black pepper  . Seafood:  o Mediterranean grilled salmon, seasoned with garlic, basil, parsley, lemon juice and black pepper o Shrimp, lemon, and spinach whole-grain pasta salad made with low fat greek yogurt  o Seared scallops with lemon orzo  o Seared tuna steaks seasoned salt, pepper, coriander topped with tomato mixture of olives, tomatoes, olive oil, minced garlic, parsley, green onions and cappers  . Meats:  o Herbed greek chicken salad  with kalamata olives, cucumber, feta  o Red bell peppers stuffed with spinach, bulgur, lean ground beef (or lentils) & topped with feta   o Kebabs: skewers of chicken, tomatoes, onions, zucchini, squash  o [place] burgers: made with red onions, mint, dill, lemon juice, feta cheese topped with roasted red peppers . Vegetarian o Cucumber salad: cucumbers, artichoke hearts, celery, red onion, feta cheese, tossed in olive oil & lemon juice  o Hummus and whole grain pita points with a greek salad (lettuce, tomato, feta, olives, cucumbers, red onion) o Lentil soup with celery, carrots made with vegetable broth, garlic, salt and pepper  o Tabouli salad: parsley, bulgur, mint, scallions, cucumbers, tomato, radishes, lemon juice, olive oil, salt and pepper.

## 2014-11-11 LAB — HM COLONOSCOPY: HM Colonoscopy: NORMAL

## 2014-11-14 ENCOUNTER — Encounter: Payer: Self-pay | Admitting: Family Medicine

## 2014-11-26 ENCOUNTER — Other Ambulatory Visit: Payer: Self-pay | Admitting: *Deleted

## 2014-11-26 NOTE — Telephone Encounter (Signed)
Patient is requesting a refill of lisinopril-hydrochlorothiazide (PRINZIDE,ZESTORETIC) 20-12.5 MG per tablet [facility] [phone], [address] [place], [place] [address] [phone] or fax [phone]

## 2014-11-27 NOTE — Telephone Encounter (Signed)
Pt did not come back for her one month check up.  How much would you have me give the pt?

## 2014-11-27 NOTE — Telephone Encounter (Signed)
Ok to refill x 90 days   Ov for further refills

## 2014-11-28 ENCOUNTER — Telehealth: Payer: Self-pay | Admitting: *Deleted

## 2014-11-28 MED ORDER — LISINOPRIL-HYDROCHLOROTHIAZIDE 20-12.5 MG PO TABS: 1.0000 | ORAL_TABLET | Freq: Every day | ORAL | Status: DC

## 2014-11-28 NOTE — Telephone Encounter (Signed)
Patient is requesting a refill of      lisinopril-hydrochlorothiazide (PRINZIDE,ZESTORETIC) 20-12.5 MG per tablet        [facility] [place]  [phone] or fax [phone]

## 2014-11-28 NOTE — Telephone Encounter (Signed)
Sent to the pharmacy by fax.  This pharmacy was not found in [place].

## 2014-11-28 NOTE — Telephone Encounter (Signed)
Faxed to the below mentioned pharmacy.  Not found in [place].

## 2015-01-14 ENCOUNTER — Other Ambulatory Visit: Payer: Self-pay

## 2015-01-14 DIAGNOSIS — Z1231 Encounter for screening mammogram for malignant neoplasm of breast: Secondary | ICD-10-CM

## 2015-02-07 ENCOUNTER — Ambulatory Visit: Payer: BLUE CROSS/BLUE SHIELD | Admitting: Internal Medicine

## 2015-02-07 ENCOUNTER — Ambulatory Visit: Payer: BLUE CROSS/BLUE SHIELD

## 2015-04-13 HISTORY — PX: REDUCTION MAMMAPLASTY: SUR839

## 2015-07-10 ENCOUNTER — Other Ambulatory Visit: Payer: Self-pay | Admitting: Internal Medicine

## 2015-07-11 NOTE — Telephone Encounter (Signed)
Left a message for a return call.

## 2015-07-15 ENCOUNTER — Telehealth: Payer: Self-pay | Admitting: Family Medicine

## 2015-07-15 NOTE — Telephone Encounter (Signed)
Pt needs follow up with [facility] for bp medication.  Please help the pt to make that appointment.  Thanks!

## 2015-07-15 NOTE — Telephone Encounter (Signed)
Sent to the pharmacy for 30 days.  Will send a message to scheduling.

## 2015-07-15 NOTE — Telephone Encounter (Signed)
lmom for pt call back.

## 2015-07-17 NOTE — Telephone Encounter (Signed)
lmom for pt to call back

## 2015-07-23 NOTE — Telephone Encounter (Signed)
lmom for pt to call back

## 2016-02-11 ENCOUNTER — Other Ambulatory Visit: Payer: Self-pay | Admitting: Obstetrics and Gynecology

## 2016-02-11 DIAGNOSIS — R928 Other abnormal and inconclusive findings on diagnostic imaging of breast: Secondary | ICD-10-CM

## 2016-02-16 ENCOUNTER — Other Ambulatory Visit: Payer: BLUE CROSS/BLUE SHIELD

## 2017-12-08 DIAGNOSIS — I10 Essential (primary) hypertension: Secondary | ICD-10-CM | POA: Insufficient documentation

## 2017-12-08 DIAGNOSIS — F330 Major depressive disorder, recurrent, mild: Secondary | ICD-10-CM | POA: Insufficient documentation

## 2017-12-08 DIAGNOSIS — F411 Generalized anxiety disorder: Secondary | ICD-10-CM | POA: Insufficient documentation

## 2020-04-12 HISTORY — PX: GASTRIC BYPASS: SHX52

## 2020-05-02 DIAGNOSIS — Z981 Arthrodesis status: Secondary | ICD-10-CM | POA: Insufficient documentation

## 2020-07-15 DIAGNOSIS — R0789 Other chest pain: Secondary | ICD-10-CM | POA: Insufficient documentation

## 2020-07-15 DIAGNOSIS — E559 Vitamin D deficiency, unspecified: Secondary | ICD-10-CM | POA: Insufficient documentation

## 2020-07-15 DIAGNOSIS — K5792 Diverticulitis of intestine, part unspecified, without perforation or abscess without bleeding: Secondary | ICD-10-CM | POA: Insufficient documentation

## 2020-07-15 DIAGNOSIS — G8929 Other chronic pain: Secondary | ICD-10-CM | POA: Insufficient documentation

## 2020-07-15 DIAGNOSIS — G4733 Obstructive sleep apnea (adult) (pediatric): Secondary | ICD-10-CM | POA: Insufficient documentation

## 2020-07-15 DIAGNOSIS — E538 Deficiency of other specified B group vitamins: Secondary | ICD-10-CM | POA: Insufficient documentation

## 2020-07-15 DIAGNOSIS — M7918 Myalgia, other site: Secondary | ICD-10-CM | POA: Insufficient documentation

## 2021-01-15 DIAGNOSIS — Z90710 Acquired absence of both cervix and uterus: Secondary | ICD-10-CM | POA: Insufficient documentation

## 2021-01-15 DIAGNOSIS — Z9889 Other specified postprocedural states: Secondary | ICD-10-CM | POA: Insufficient documentation

## 2021-01-15 DIAGNOSIS — Z9049 Acquired absence of other specified parts of digestive tract: Secondary | ICD-10-CM | POA: Insufficient documentation

## 2021-01-15 DIAGNOSIS — Z9884 Bariatric surgery status: Secondary | ICD-10-CM | POA: Insufficient documentation

## 2021-01-16 DIAGNOSIS — E611 Iron deficiency: Secondary | ICD-10-CM | POA: Insufficient documentation

## 2021-04-29 DIAGNOSIS — B009 Herpesviral infection, unspecified: Secondary | ICD-10-CM | POA: Insufficient documentation

## 2021-04-29 DIAGNOSIS — M5412 Radiculopathy, cervical region: Secondary | ICD-10-CM | POA: Insufficient documentation

## 2021-04-29 DIAGNOSIS — Z9189 Other specified personal risk factors, not elsewhere classified: Secondary | ICD-10-CM | POA: Insufficient documentation

## 2021-04-29 DIAGNOSIS — F430 Acute stress reaction: Secondary | ICD-10-CM | POA: Insufficient documentation

## 2021-04-29 DIAGNOSIS — F54 Psychological and behavioral factors associated with disorders or diseases classified elsewhere: Secondary | ICD-10-CM | POA: Insufficient documentation

## 2021-04-29 DIAGNOSIS — A530 Latent syphilis, unspecified as early or late: Secondary | ICD-10-CM | POA: Insufficient documentation

## 2021-04-29 DIAGNOSIS — E781 Pure hyperglyceridemia: Secondary | ICD-10-CM | POA: Insufficient documentation

## 2021-04-29 DIAGNOSIS — F909 Attention-deficit hyperactivity disorder, unspecified type: Secondary | ICD-10-CM | POA: Insufficient documentation

## 2021-06-19 DIAGNOSIS — D320 Benign neoplasm of cerebral meninges: Secondary | ICD-10-CM | POA: Diagnosis not present

## 2021-06-19 DIAGNOSIS — R519 Headache, unspecified: Secondary | ICD-10-CM | POA: Diagnosis not present

## 2021-06-19 DIAGNOSIS — G43109 Migraine with aura, not intractable, without status migrainosus: Secondary | ICD-10-CM | POA: Diagnosis not present

## 2021-09-21 DIAGNOSIS — G43919 Migraine, unspecified, intractable, without status migrainosus: Secondary | ICD-10-CM | POA: Insufficient documentation

## 2021-09-21 DIAGNOSIS — Z136 Encounter for screening for cardiovascular disorders: Secondary | ICD-10-CM | POA: Diagnosis not present

## 2021-09-21 DIAGNOSIS — I10 Essential (primary) hypertension: Secondary | ICD-10-CM | POA: Diagnosis not present

## 2021-09-21 DIAGNOSIS — E611 Iron deficiency: Secondary | ICD-10-CM | POA: Diagnosis not present

## 2021-09-21 DIAGNOSIS — E559 Vitamin D deficiency, unspecified: Secondary | ICD-10-CM | POA: Diagnosis not present

## 2021-09-21 DIAGNOSIS — E538 Deficiency of other specified B group vitamins: Secondary | ICD-10-CM | POA: Diagnosis not present

## 2021-09-21 DIAGNOSIS — Z0000 Encounter for general adult medical examination without abnormal findings: Secondary | ICD-10-CM | POA: Diagnosis not present

## 2021-09-21 DIAGNOSIS — D329 Benign neoplasm of meninges, unspecified: Secondary | ICD-10-CM | POA: Insufficient documentation

## 2021-10-21 ENCOUNTER — Emergency Department
Admission: EM | Admit: 2021-10-21 | Discharge: 2021-10-21 | Disposition: A | Discharge status: Home / Self Care | Payer: 59 | Attending: Emergency Medicine | Admitting: Emergency Medicine

## 2021-10-21 ENCOUNTER — Emergency Department: Payer: 59

## 2021-10-21 DIAGNOSIS — R1031 Right lower quadrant pain: Secondary | ICD-10-CM | POA: Diagnosis not present

## 2021-10-21 DIAGNOSIS — I10 Essential (primary) hypertension: Secondary | ICD-10-CM | POA: Insufficient documentation

## 2021-10-21 DIAGNOSIS — N200 Calculus of kidney: Secondary | ICD-10-CM | POA: Diagnosis not present

## 2021-10-21 DIAGNOSIS — R1084 Generalized abdominal pain: Secondary | ICD-10-CM

## 2021-10-21 DIAGNOSIS — K5900 Constipation, unspecified: Secondary | ICD-10-CM | POA: Insufficient documentation

## 2021-10-21 DIAGNOSIS — D1803 Hemangioma of intra-abdominal structures: Secondary | ICD-10-CM | POA: Diagnosis not present

## 2021-10-21 LAB — URINALYSIS, ROUTINE W REFLEX MICROSCOPIC
Bacteria, UA: NONE SEEN
Bilirubin Urine: NEGATIVE
Glucose, UA: NEGATIVE mg/dL
Hgb urine dipstick: NEGATIVE
Ketones, ur: NEGATIVE mg/dL
Leukocytes,Ua: NEGATIVE
Nitrite: NEGATIVE
Protein, ur: 30 mg/dL — AB
Specific Gravity, Urine: 1.029 (ref 1.005–1.030)
pH: 5 (ref 5.0–8.0)

## 2021-10-21 LAB — COMPREHENSIVE METABOLIC PANEL
ALT: 22 U/L (ref 0–44)
AST: 24 U/L (ref 15–41)
Albumin: 4.3 g/dL (ref 3.5–5.0)
Alkaline Phosphatase: 49 U/L (ref 38–126)
Anion gap: 7 (ref 5–15)
BUN: 9 mg/dL (ref 6–20)
CO2: 25 mmol/L (ref 22–32)
Calcium: 9.1 mg/dL (ref 8.9–10.3)
Chloride: 106 mmol/L (ref 98–111)
Creatinine, Ser: 0.86 mg/dL (ref 0.44–1.00)
GFR, Estimated: >60 mL/min (ref >60)
Glucose, Bld: 104 mg/dL — ABNORMAL HIGH (ref 70–99)
Potassium: 4 mmol/L (ref 3.5–5.1)
Sodium: 138 mmol/L (ref 135–145)
Total Bilirubin: 1.2 mg/dL (ref 0.3–1.2)
Total Protein: 6.9 g/dL (ref 6.5–8.1)

## 2021-10-21 LAB — POC URINE PREG, ED: Preg Test, Ur: NEGATIVE

## 2021-10-21 LAB — CBC
HCT: 39.7 % (ref 36.0–46.0)
Hemoglobin: 12.8 g/dL (ref 12.0–15.0)
MCH: 28.4 pg (ref 26.0–34.0)
MCHC: 32.2 g/dL (ref 30.0–36.0)
MCV: 88.2 fL (ref 80.0–100.0)
Platelets: 356 10*3/uL (ref 150–400)
RBC: 4.5 MIL/uL (ref 3.87–5.11)
RDW: 13.3 % (ref 11.5–15.5)
WBC: 6.8 10*3/uL (ref 4.0–10.5)
nRBC: 0 % (ref 0.0–0.2)

## 2021-10-21 LAB — LIPASE, BLOOD: Lipase: 33 U/L (ref 11–51)

## 2021-10-21 MED ORDER — FLEET ENEMA 7-19 GM/118ML RE ENEM
1.0000 | ENEMA | Freq: Once | RECTAL | Status: AC
  Administered 2021-10-21: 1 via RECTAL

## 2021-10-21 MED ORDER — SODIUM CHLORIDE 0.9 % IV BOLUS
1000.0000 mL | Freq: Once | INTRAVENOUS | Status: AC
  Administered 2021-10-21: 1000 mL via INTRAVENOUS

## 2021-10-21 MED ORDER — ONDANSETRON HCL 4 MG/2ML IJ SOLN
4.0000 mg | Freq: Once | INTRAMUSCULAR | Status: AC
  Administered 2021-10-21: 4 mg via INTRAVENOUS
  Filled 2021-10-21: qty 2

## 2021-10-21 MED ORDER — ALUM & MAG HYDROXIDE-SIMETH 200-200-20 MG/5ML PO SUSP
30.0000 mL | Freq: Once | ORAL | Status: AC
  Administered 2021-10-21: 30 mL via ORAL
  Filled 2021-10-21: qty 30

## 2021-10-21 MED ORDER — IOHEXOL 300 MG/ML  SOLN
100.0000 mL | Freq: Once | INTRAMUSCULAR | Status: AC | PRN
  Administered 2021-10-21: 100 mL via INTRAVENOUS

## 2021-10-21 MED ORDER — MORPHINE SULFATE (PF) 4 MG/ML IV SOLN
4.0000 mg | Freq: Once | INTRAVENOUS | Status: AC
  Administered 2021-10-21: 4 mg via INTRAVENOUS
  Filled 2021-10-21: qty 1

## 2021-10-21 NOTE — Discharge Instructions (Addendum)
Your CAT scan showed constipation and the liver hemangiomas.  Please follow-up with your primary doctor regarding hemangiomas.  You can use the enema at home and continue to take oral laxatives.  If your abdominal pain is worsening you are unable to eat or drink or you are not able to have a bowel movement please return to the emergency department

## 2021-10-21 NOTE — ED Notes (Signed)
Dc ppw provided. Followup and RX information reviewed as applicable. pt declines VS at this time and provides verbal consent for dc at this time. Pt alert and oriented to lobby.
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
## 2021-10-21 NOTE — ED Triage Notes (Addendum)
Pt c/o generalized abd pain since Monday with fever started last night , pain is worse to the LLQ radiating into the back. Pt is in NAD on arrival. Pt states she started taking cipro on Monday , states she has a hx of diverticulitis but has never had this much abd distention

## 2021-11-16 DIAGNOSIS — R591 Generalized enlarged lymph nodes: Secondary | ICD-10-CM | POA: Diagnosis not present

## 2022-01-11 DIAGNOSIS — G43919 Migraine, unspecified, intractable, without status migrainosus: Secondary | ICD-10-CM | POA: Diagnosis not present

## 2022-01-11 DIAGNOSIS — Z1231 Encounter for screening mammogram for malignant neoplasm of breast: Secondary | ICD-10-CM | POA: Diagnosis not present

## 2022-01-11 DIAGNOSIS — R69 Illness, unspecified: Secondary | ICD-10-CM | POA: Diagnosis not present

## 2022-01-11 DIAGNOSIS — D1803 Hemangioma of intra-abdominal structures: Secondary | ICD-10-CM | POA: Diagnosis not present

## 2022-01-25 DIAGNOSIS — R051 Acute cough: Secondary | ICD-10-CM | POA: Diagnosis not present

## 2022-01-25 DIAGNOSIS — J019 Acute sinusitis, unspecified: Secondary | ICD-10-CM | POA: Diagnosis not present

## 2022-01-26 DIAGNOSIS — R519 Headache, unspecified: Secondary | ICD-10-CM | POA: Diagnosis not present

## 2022-01-26 DIAGNOSIS — Z9884 Bariatric surgery status: Secondary | ICD-10-CM | POA: Diagnosis not present

## 2022-01-26 DIAGNOSIS — H93A3 Pulsatile tinnitus, bilateral: Secondary | ICD-10-CM | POA: Diagnosis not present

## 2022-01-26 DIAGNOSIS — Z8669 Personal history of other diseases of the nervous system and sense organs: Secondary | ICD-10-CM | POA: Diagnosis not present

## 2022-01-26 DIAGNOSIS — D329 Benign neoplasm of meninges, unspecified: Secondary | ICD-10-CM | POA: Diagnosis not present

## 2022-01-27 DIAGNOSIS — J302 Other seasonal allergic rhinitis: Secondary | ICD-10-CM | POA: Diagnosis not present

## 2022-02-02 ENCOUNTER — Other Ambulatory Visit: Payer: Self-pay | Admitting: Neurology

## 2022-02-02 DIAGNOSIS — R519 Headache, unspecified: Secondary | ICD-10-CM

## 2022-03-01 ENCOUNTER — Ambulatory Visit
Admission: EM | Admit: 2022-03-01 | Discharge: 2022-03-01 | Disposition: A | Discharge status: Home / Self Care | Payer: 59

## 2022-03-01 DIAGNOSIS — R0982 Postnasal drip: Secondary | ICD-10-CM

## 2022-03-01 DIAGNOSIS — R053 Chronic cough: Secondary | ICD-10-CM

## 2022-03-01 DIAGNOSIS — J309 Allergic rhinitis, unspecified: Secondary | ICD-10-CM | POA: Diagnosis not present

## 2022-03-01 MED ORDER — PROMETHAZINE-DM 6.25-15 MG/5ML PO SYRP: 5.0000 mL | ORAL_SOLUTION | Freq: Four times a day (QID) | ORAL | 0 refills | Status: DC | PRN

## 2022-03-01 MED ORDER — MONTELUKAST SODIUM 10 MG PO TABS: 10.0000 mg | ORAL_TABLET | Freq: Every day | ORAL | 2 refills | Status: AC

## 2022-03-01 MED ORDER — FEXOFENADINE HCL 180 MG PO TABS: 180.0000 mg | ORAL_TABLET | Freq: Every day | ORAL | 1 refills | Status: AC

## 2022-03-01 MED ORDER — FLUTICASONE PROPIONATE 50 MCG/ACT NA SUSP: 1.0000 | Freq: Every day | NASAL | 2 refills | Status: AC

## 2022-03-01 MED ORDER — METHYLPREDNISOLONE SODIUM SUCC 125 MG IJ SOLR
80.0000 mg | Freq: Once | INTRAMUSCULAR | Status: AC
  Administered 2022-03-01: 80 mg via INTRAMUSCULAR

## 2022-03-01 MED ORDER — IPRATROPIUM BROMIDE 0.06 % NA SOLN: 2.0000 | Freq: Three times a day (TID) | NASAL | 1 refills | Status: DC
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
## 2022-03-01 NOTE — Discharge Instructions (Addendum)
Your symptoms and my physical exam findings are concerning for exacerbation of your underlying allergies.     Please see the list below for recommended medications, dosages and frequencies to provide relief of current symptoms:     Solu-Medrol IM (methylprednisolone):  To quickly address your significant respiratory inflammation, you were provided with an injection of Solu-Medrol in the office today.  You should continue to feel the full benefit of the steroid for the next 4 to 6 hours.    Allegra (fexofenadine): This is an excellent second-generation antihistamine that helps to reduce respiratory inflammatory response to environmental allergens.  This medication is not known to cause daytime sleepiness so it can be taken in the daytime.  If you find that it does make you sleepy, please feel free to take it at bedtime.  I recommend that you stay on this medication throughout the winter.   Singulair (montelukast): This is a mast cell stabilizer that works well with antihistamines.  Mast cells are responsible for stimulating histamine production so you can imagine that if we can reduce the activity of your mast cells, then fewer histamines will be produced and inflammation caused by allergy exposure will be significantly reduced.  I recommend that you take this medication at the same time you take your antihistamine.  You can discontinue this medication after 30 days.   Flonase (fluticasone): This is a steroid nasal spray that you use once daily, 1 spray in each nare.  This medication does not work well if you decide to use it only used as you feel you need to, it works best used on a daily basis.  After 3 to 5 days of use, you will notice significant reduction of the inflammation and mucus production that is currently being caused by exposure to allergens, whether seasonal or environmental.  The most common side effect of this medication is nosebleeds.  If you experience a nosebleed, please discontinue use  for 1 week, then feel free to resume.  I have provided you with a prescription.  I recommend that you stay on this medication throughout the winter.   Atrovent (ipratropium): This is an excellent nasal decongestant spray I have added to your recommended nasal steroid that will not cause rebound congestion, please instill 2 sprays into each nare with each use.  Because nasal steroids can take several days before they begin to provide full benefit, I recommend that you use this spray in addition to the nasal steroid prescribed for you.  Please use it after you have used your nasal steroid and repeat up to 4 times daily as needed.  Please discontinue this medication when you realize that you have forgotten to use it more often than you have remembered to use it.    Promethazine DM: Promethazine is both a nasal decongestant and an antinausea medication that makes most patients feel fairly sleepy.  The DM is dextromethorphan, a cough suppressant found in many over-the-counter cough medications.  Please take 5 mL before bedtime to minimize your cough which will help you sleep better.  I have sent a prescription for this medication to your pharmacy.  If you find that you have not had improvement of your symptoms in the next 5 to 7 days, please follow-up with your primary care provider or return here to urgent care for repeat evaluation and further recommendations.   Thank you for visiting urgent care today.  We appreciate the opportunity to participate in your care.      If  you find that your health insurance will not pay for allergy medications, please consider downloading the GoodRx app and using to get a better price than the "off the shelf" price.

## 2022-03-01 NOTE — ED Triage Notes (Signed)
Pt. Presents to UC w/ c/o a cough, congestion, body aches and fatigue for the past 6 weeks. Pt. States she was seen and treated for sinusitis in Oct. With Augmentin. Pt. Expresses concern for atypical pneumonia.

## 2022-03-14 ENCOUNTER — Other Ambulatory Visit: Payer: 59

## 2022-03-15 DIAGNOSIS — Z9109 Other allergy status, other than to drugs and biological substances: Secondary | ICD-10-CM | POA: Insufficient documentation

## 2022-03-15 DIAGNOSIS — F32A Depression, unspecified: Secondary | ICD-10-CM | POA: Insufficient documentation

## 2022-03-15 DIAGNOSIS — R69 Illness, unspecified: Secondary | ICD-10-CM | POA: Diagnosis not present

## 2022-03-15 DIAGNOSIS — I10 Essential (primary) hypertension: Secondary | ICD-10-CM | POA: Diagnosis not present

## 2022-03-29 ENCOUNTER — Ambulatory Visit
Admission: RE | Admit: 2022-03-29 | Discharge: 2022-03-29 | Disposition: A | Discharge status: Home / Self Care | Payer: 59 | Source: Ambulatory Visit | Attending: Neurology | Admitting: Neurology

## 2022-03-29 DIAGNOSIS — D329 Benign neoplasm of meninges, unspecified: Secondary | ICD-10-CM | POA: Diagnosis not present

## 2022-03-29 DIAGNOSIS — R519 Headache, unspecified: Secondary | ICD-10-CM

## 2022-03-29 MED ORDER — GADOPICLENOL 0.5 MMOL/ML IV SOLN
7.5000 mL | Freq: Once | INTRAVENOUS | Status: AC | PRN
  Administered 2022-03-29: 7.5 mL via INTRAVENOUS

## 2022-04-28 ENCOUNTER — Other Ambulatory Visit: Payer: Self-pay | Admitting: Clinical Medical Laboratory

## 2022-04-28 DIAGNOSIS — Z1231 Encounter for screening mammogram for malignant neoplasm of breast: Secondary | ICD-10-CM

## 2022-05-03 ENCOUNTER — Other Ambulatory Visit: Payer: Self-pay | Admitting: Family Medicine

## 2022-05-03 DIAGNOSIS — K7689 Other specified diseases of liver: Secondary | ICD-10-CM

## 2022-05-03 DIAGNOSIS — D329 Benign neoplasm of meninges, unspecified: Secondary | ICD-10-CM

## 2022-06-01 ENCOUNTER — Other Ambulatory Visit: Payer: 59

## 2022-06-03 ENCOUNTER — Ambulatory Visit
Admission: RE | Admit: 2022-06-03 | Discharge: 2022-06-03 | Disposition: A | Discharge status: Home / Self Care | Payer: 59 | Source: Ambulatory Visit | Attending: Family Medicine | Admitting: Family Medicine

## 2022-06-03 DIAGNOSIS — D329 Benign neoplasm of meninges, unspecified: Secondary | ICD-10-CM

## 2022-06-03 DIAGNOSIS — K7689 Other specified diseases of liver: Secondary | ICD-10-CM

## 2022-06-03 MED ORDER — GADOPICLENOL 0.5 MMOL/ML IV SOLN
7.0000 mL | Freq: Once | INTRAVENOUS | Status: AC | PRN
  Administered 2022-06-03: 7 mL via INTRAVENOUS

## 2022-06-16 ENCOUNTER — Ambulatory Visit
Admission: RE | Admit: 2022-06-16 | Discharge: 2022-06-16 | Disposition: A | Discharge status: Home / Self Care | Payer: 59 | Source: Ambulatory Visit | Attending: Family Medicine | Admitting: Family Medicine

## 2022-06-16 DIAGNOSIS — Z1231 Encounter for screening mammogram for malignant neoplasm of breast: Secondary | ICD-10-CM

## 2022-06-30 ENCOUNTER — Inpatient Hospital Stay: Payer: 59 | Admitting: Oncology

## 2022-06-30 ENCOUNTER — Inpatient Hospital Stay: Payer: 59

## 2022-07-01 ENCOUNTER — Inpatient Hospital Stay: Payer: 59 | Attending: Oncology | Admitting: Oncology

## 2022-07-01 ENCOUNTER — Inpatient Hospital Stay: Payer: 59

## 2022-07-01 ENCOUNTER — Encounter: Payer: Self-pay | Admitting: Oncology

## 2022-07-01 VITALS — BP 121/83 | HR 75 | Temp 98.6°F | Resp 17 | Wt 167.0 lb

## 2022-07-01 DIAGNOSIS — Z9884 Bariatric surgery status: Secondary | ICD-10-CM | POA: Insufficient documentation

## 2022-07-01 DIAGNOSIS — D509 Iron deficiency anemia, unspecified: Secondary | ICD-10-CM | POA: Diagnosis not present

## 2022-07-01 DIAGNOSIS — E611 Iron deficiency: Secondary | ICD-10-CM

## 2022-07-01 NOTE — Progress Notes (Signed)
Patient here for new hematology appointment, expresses concerns of fatigue, headaches, palpitations and SOB with exertion. Oral Iron caused extreme impaction
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
## 2022-07-05 MED FILL — Iron Sucrose Inj 20 MG/ML (Fe Equiv): INTRAVENOUS | Qty: 10 | Status: AC

## 2022-07-06 ENCOUNTER — Inpatient Hospital Stay: Payer: 59

## 2022-07-06 VITALS — BP 120/83 | HR 72 | Temp 98.0°F | Resp 18

## 2022-07-06 DIAGNOSIS — D509 Iron deficiency anemia, unspecified: Secondary | ICD-10-CM | POA: Diagnosis not present

## 2022-07-06 DIAGNOSIS — E611 Iron deficiency: Secondary | ICD-10-CM

## 2022-07-06 MED ORDER — SODIUM CHLORIDE 0.9 % IV SOLN
200.0000 mg | Freq: Once | INTRAVENOUS | Status: AC
  Administered 2022-07-06: 200 mg via INTRAVENOUS
  Filled 2022-07-06: qty 10

## 2022-07-06 MED ORDER — SODIUM CHLORIDE 0.9 % IV SOLN
Freq: Once | INTRAVENOUS | Status: AC
  Filled 2022-07-06: qty 250

## 2022-07-08 ENCOUNTER — Inpatient Hospital Stay: Payer: 59

## 2022-07-08 VITALS — BP 105/67 | HR 76 | Temp 99.0°F | Resp 18

## 2022-07-08 DIAGNOSIS — D509 Iron deficiency anemia, unspecified: Secondary | ICD-10-CM | POA: Diagnosis not present

## 2022-07-08 DIAGNOSIS — E611 Iron deficiency: Secondary | ICD-10-CM

## 2022-07-08 MED ORDER — SODIUM CHLORIDE 0.9 % IV SOLN
Freq: Once | INTRAVENOUS | Status: AC
  Filled 2022-07-08: qty 250

## 2022-07-08 MED ORDER — SODIUM CHLORIDE 0.9 % IV SOLN
200.0000 mg | Freq: Once | INTRAVENOUS | Status: AC
  Administered 2022-07-08: 200 mg via INTRAVENOUS
  Filled 2022-07-08: qty 10

## 2022-07-08 MED FILL — Iron Sucrose Inj 20 MG/ML (Fe Equiv): INTRAVENOUS | Qty: 10 | Status: AC

## 2022-07-12 ENCOUNTER — Inpatient Hospital Stay: Payer: 59 | Attending: Oncology

## 2022-07-12 VITALS — BP 125/78 | HR 93 | Temp 97.8°F | Resp 16

## 2022-07-12 DIAGNOSIS — Z9884 Bariatric surgery status: Secondary | ICD-10-CM | POA: Diagnosis not present

## 2022-07-12 DIAGNOSIS — D509 Iron deficiency anemia, unspecified: Secondary | ICD-10-CM | POA: Insufficient documentation

## 2022-07-12 DIAGNOSIS — E611 Iron deficiency: Secondary | ICD-10-CM

## 2022-07-12 MED ORDER — SODIUM CHLORIDE 0.9 % IV SOLN
200.0000 mg | Freq: Once | INTRAVENOUS | Status: AC
  Administered 2022-07-12: 200 mg via INTRAVENOUS
  Filled 2022-07-12: qty 10

## 2022-07-12 MED ORDER — SODIUM CHLORIDE 0.9 % IV SOLN
Freq: Once | INTRAVENOUS | Status: AC
  Filled 2022-07-12: qty 250

## 2022-07-12 NOTE — Progress Notes (Signed)
Pt declined 30 minute post observation 

## 2022-07-14 ENCOUNTER — Inpatient Hospital Stay: Payer: 59

## 2022-07-14 VITALS — BP 114/76 | HR 79 | Temp 98.5°F | Resp 17

## 2022-07-14 DIAGNOSIS — D509 Iron deficiency anemia, unspecified: Secondary | ICD-10-CM | POA: Diagnosis not present

## 2022-07-14 DIAGNOSIS — E611 Iron deficiency: Secondary | ICD-10-CM

## 2022-07-14 MED ORDER — SODIUM CHLORIDE 0.9 % IV SOLN
Freq: Once | INTRAVENOUS | Status: AC
  Filled 2022-07-14: qty 250

## 2022-07-14 MED ORDER — SODIUM CHLORIDE 0.9 % IV SOLN
200.0000 mg | Freq: Once | INTRAVENOUS | Status: AC
  Administered 2022-07-14: 200 mg via INTRAVENOUS
  Filled 2022-07-14: qty 10

## 2022-07-14 MED ORDER — SODIUM CHLORIDE 0.9% FLUSH
10.0000 mL | Freq: Once | INTRAVENOUS | Status: AC | PRN
  Administered 2022-07-14: 10 mL
  Filled 2022-07-14: qty 10

## 2022-07-14 NOTE — Patient Instructions (Signed)

## 2022-07-14 NOTE — Progress Notes (Signed)
Patient tolerated Venofer infusion well. Explained recommendation of 30 min post monitoring. Patient refused to wait post monitoring. Educated on what signs to watch for & to call with any concerns. No questions, discharged. Stable  

## 2022-07-15 ENCOUNTER — Inpatient Hospital Stay: Payer: 59

## 2022-07-19 ENCOUNTER — Inpatient Hospital Stay: Payer: 59

## 2022-07-21 ENCOUNTER — Inpatient Hospital Stay: Payer: 59

## 2022-07-21 VITALS — BP 125/76 | HR 75 | Temp 97.9°F | Resp 16

## 2022-07-21 DIAGNOSIS — D509 Iron deficiency anemia, unspecified: Secondary | ICD-10-CM | POA: Diagnosis not present

## 2022-07-21 DIAGNOSIS — E611 Iron deficiency: Secondary | ICD-10-CM

## 2022-07-21 MED ORDER — SODIUM CHLORIDE 0.9 % IV SOLN
Freq: Once | INTRAVENOUS | Status: AC
  Filled 2022-07-21: qty 250

## 2022-07-21 MED ORDER — SODIUM CHLORIDE 0.9 % IV SOLN
200.0000 mg | Freq: Once | INTRAVENOUS | Status: AC
  Administered 2022-07-21: 200 mg via INTRAVENOUS
  Filled 2022-07-21: qty 200

## 2022-11-05 ENCOUNTER — Other Ambulatory Visit: Payer: Self-pay | Admitting: *Deleted

## 2022-11-05 DIAGNOSIS — E611 Iron deficiency: Secondary | ICD-10-CM

## 2022-11-08 ENCOUNTER — Inpatient Hospital Stay: Payer: 59 | Attending: Oncology

## 2022-11-08 DIAGNOSIS — D509 Iron deficiency anemia, unspecified: Secondary | ICD-10-CM | POA: Insufficient documentation

## 2022-11-08 DIAGNOSIS — E611 Iron deficiency: Secondary | ICD-10-CM

## 2022-11-08 DIAGNOSIS — Z9884 Bariatric surgery status: Secondary | ICD-10-CM | POA: Diagnosis not present

## 2022-11-08 LAB — CBC WITH DIFFERENTIAL/PLATELET
Abs Immature Granulocytes: 0.02 10*3/uL (ref 0.00–0.07)
Basophils Absolute: 0.1 10*3/uL (ref 0.0–0.1)
Basophils Relative: 1 %
Eosinophils Absolute: 0.1 10*3/uL (ref 0.0–0.5)
Eosinophils Relative: 2 %
HCT: 37.5 % (ref 36.0–46.0)
Hemoglobin: 12.6 g/dL (ref 12.0–15.0)
Immature Granulocytes: 0 %
Lymphocytes Relative: 33 %
Lymphs Abs: 1.9 10*3/uL (ref 0.7–4.0)
MCH: 30.2 pg (ref 26.0–34.0)
MCHC: 33.6 g/dL (ref 30.0–36.0)
MCV: 89.9 fL (ref 80.0–100.0)
Monocytes Absolute: 0.5 10*3/uL (ref 0.1–1.0)
Monocytes Relative: 8 %
Neutro Abs: 3.1 10*3/uL (ref 1.7–7.7)
Neutrophils Relative %: 56 %
Platelets: 334 10*3/uL (ref 150–400)
RBC: 4.17 MIL/uL (ref 3.87–5.11)
RDW: 12.1 % (ref 11.5–15.5)
WBC: 5.7 10*3/uL (ref 4.0–10.5)
nRBC: 0 % (ref 0.0–0.2)

## 2022-11-08 LAB — IRON AND TIBC
Iron: 112 ug/dL (ref 28–170)
Saturation Ratios: 32 % — ABNORMAL HIGH (ref 10.4–31.8)
TIBC: 350 ug/dL (ref 250–450)
UIBC: 238 ug/dL

## 2022-11-08 LAB — FERRITIN: Ferritin: 70 ng/mL (ref 11–307)

## 2022-11-08 MED FILL — Iron Sucrose Inj 20 MG/ML (Fe Equiv): INTRAVENOUS | Qty: 10 | Status: AC

## 2022-11-09 ENCOUNTER — Inpatient Hospital Stay: Payer: 59

## 2022-11-09 ENCOUNTER — Encounter: Payer: Self-pay | Admitting: Oncology

## 2022-11-09 ENCOUNTER — Inpatient Hospital Stay (HOSPITAL_BASED_OUTPATIENT_CLINIC_OR_DEPARTMENT_OTHER): Payer: 59 | Admitting: Oncology

## 2022-11-09 VITALS — BP 106/82 | HR 77 | Temp 98.1°F | Resp 18 | Wt 166.9 lb

## 2022-11-09 DIAGNOSIS — D509 Iron deficiency anemia, unspecified: Secondary | ICD-10-CM | POA: Diagnosis not present

## 2022-11-09 DIAGNOSIS — E611 Iron deficiency: Secondary | ICD-10-CM | POA: Diagnosis not present

## 2022-11-09 NOTE — Progress Notes (Signed)
Patient here today for follow up regarding anemia.  
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
## 2023-03-01 ENCOUNTER — Encounter: Payer: Self-pay | Admitting: Oncology

## 2023-03-14 ENCOUNTER — Other Ambulatory Visit: Payer: Self-pay

## 2023-03-14 ENCOUNTER — Inpatient Hospital Stay: Payer: 59 | Attending: Oncology

## 2023-03-14 DIAGNOSIS — D509 Iron deficiency anemia, unspecified: Secondary | ICD-10-CM | POA: Insufficient documentation

## 2023-03-14 DIAGNOSIS — E611 Iron deficiency: Secondary | ICD-10-CM

## 2023-03-14 LAB — CBC WITH DIFFERENTIAL (CANCER CENTER ONLY)
Abs Immature Granulocytes: 0.03 10*3/uL (ref 0.00–0.07)
Basophils Absolute: 0.1 10*3/uL (ref 0.0–0.1)
Basophils Relative: 1 %
Eosinophils Absolute: 0.2 10*3/uL (ref 0.0–0.5)
Eosinophils Relative: 2 %
HCT: 37.6 % (ref 36.0–46.0)
Hemoglobin: 12.6 g/dL (ref 12.0–15.0)
Immature Granulocytes: 0 %
Lymphocytes Relative: 26 %
Lymphs Abs: 2.1 10*3/uL (ref 0.7–4.0)
MCH: 30.7 pg (ref 26.0–34.0)
MCHC: 33.5 g/dL (ref 30.0–36.0)
MCV: 91.7 fL (ref 80.0–100.0)
Monocytes Absolute: 0.6 10*3/uL (ref 0.1–1.0)
Monocytes Relative: 7 %
Neutro Abs: 5.4 10*3/uL (ref 1.7–7.7)
Neutrophils Relative %: 64 %
Platelet Count: 340 10*3/uL (ref 150–400)
RBC: 4.1 MIL/uL (ref 3.87–5.11)
RDW: 12.1 % (ref 11.5–15.5)
WBC Count: 8.4 10*3/uL (ref 4.0–10.5)
nRBC: 0 % (ref 0.0–0.2)

## 2023-03-14 LAB — IRON AND TIBC
Iron: 92 ug/dL (ref 28–170)
Saturation Ratios: 28 % (ref 10.4–31.8)
TIBC: 326 ug/dL (ref 250–450)
UIBC: 234 ug/dL

## 2023-03-14 LAB — FERRITIN: Ferritin: 47 ng/mL (ref 11–307)

## 2023-03-15 ENCOUNTER — Inpatient Hospital Stay: Payer: 59 | Admitting: Oncology

## 2023-03-15 ENCOUNTER — Inpatient Hospital Stay: Payer: 59

## 2023-03-16 ENCOUNTER — Other Ambulatory Visit: Payer: 59

## 2023-07-10 ENCOUNTER — Encounter (HOSPITAL_COMMUNITY): Payer: Self-pay

## 2023-07-10 ENCOUNTER — Emergency Department (HOSPITAL_COMMUNITY)

## 2023-07-10 ENCOUNTER — Emergency Department (HOSPITAL_COMMUNITY)
Admission: EM | Admit: 2023-07-10 | Discharge: 2023-07-11 | Disposition: A | Discharge status: Home / Self Care | Attending: Emergency Medicine | Admitting: Emergency Medicine

## 2023-07-10 ENCOUNTER — Other Ambulatory Visit: Payer: Self-pay

## 2023-07-10 DIAGNOSIS — R1012 Left upper quadrant pain: Secondary | ICD-10-CM | POA: Diagnosis present

## 2023-07-10 LAB — BASIC METABOLIC PANEL WITH GFR
Anion gap: 9 (ref 5–15)
BUN: 12 mg/dL (ref 6–20)
CO2: 23 mmol/L (ref 22–32)
Calcium: 9 mg/dL (ref 8.9–10.3)
Chloride: 106 mmol/L (ref 98–111)
Creatinine, Ser: 0.67 mg/dL (ref 0.44–1.00)
GFR, Estimated: >60 mL/min (ref >60)
Glucose, Bld: 94 mg/dL (ref 70–99)
Potassium: 3.7 mmol/L (ref 3.5–5.1)
Sodium: 138 mmol/L (ref 135–145)

## 2023-07-10 LAB — CBC
HCT: 37.2 % (ref 36.0–46.0)
Hemoglobin: 12.4 g/dL (ref 12.0–15.0)
MCH: 29.9 pg (ref 26.0–34.0)
MCHC: 33.3 g/dL (ref 30.0–36.0)
MCV: 89.6 fL (ref 80.0–100.0)
Platelets: 339 10*3/uL (ref 150–400)
RBC: 4.15 MIL/uL (ref 3.87–5.11)
RDW: 12.4 % (ref 11.5–15.5)
WBC: 9.6 10*3/uL (ref 4.0–10.5)
nRBC: 0 % (ref 0.0–0.2)

## 2023-07-10 LAB — TROPONIN I (HIGH SENSITIVITY): Troponin I (High Sensitivity): <2 ng/L (ref <18)

## 2023-07-10 MED ORDER — IOHEXOL 300 MG/ML  SOLN
100.0000 mL | Freq: Once | INTRAMUSCULAR | Status: AC | PRN
  Administered 2023-07-11: 100 mL via INTRAVENOUS

## 2023-07-10 MED ORDER — ONDANSETRON HCL 4 MG/2ML IJ SOLN
4.0000 mg | Freq: Once | INTRAMUSCULAR | Status: AC
  Administered 2023-07-11: 4 mg via INTRAVENOUS
  Filled 2023-07-10: qty 2

## 2023-07-10 MED ORDER — SODIUM CHLORIDE 0.9 % IV BOLUS
1000.0000 mL | Freq: Once | INTRAVENOUS | Status: AC
  Administered 2023-07-11: 1000 mL via INTRAVENOUS

## 2023-07-10 MED ORDER — KETOROLAC TROMETHAMINE 30 MG/ML IJ SOLN
30.0000 mg | Freq: Once | INTRAMUSCULAR | Status: AC
  Administered 2023-07-11: 30 mg via INTRAVENOUS
  Filled 2023-07-10: qty 1

## 2023-07-10 NOTE — ED Provider Notes (Incomplete)
 [place] [facility]GENCY DEPARTMENT AT [facility] Provider Note   CSN: [id number] Arrival date & time: 07/10/23  2031     History  Chief Complaint  Patient presents with  . Chest Pain  . Shoulder Pain    [name] is a 48 y.o. female.  Patient is a 48 year old female with past medical history of gastric bypass, cholecystectomy.  Patient presenting today with complaints of left upper quadrant pain.  This has been present for the past several days.  Symptoms initially began almost 3 weeks ago.  She was seen at that time by her primary doctor and started on Augmentin.  This seemed to help, however now symptoms have returned.  She describes a sharp, stabbing pain to the left upper quadrant that radiates up into her chest.  This is worse when she moves or palpates the area.  No alleviating factors.  No fevers or chills.  No bloody stools.       Home Medications Prior to Admission medications   Medication Sig Start Date End Date Taking? Authorizing Provider  acetaminophen (TYLENOL) 500 MG tablet Take by mouth. 07/15/20   [provider]  Biotin 10 MG TABS Take 1 tablet by mouth at bedtime.    [provider]  buPROPion (WELLBUTRIN XL) 300 MG 24 hr tablet Take 300 mg by mouth daily.    [provider]  busPIRone (BUSPAR) 5 MG tablet Take by mouth. 12/05/17   [provider]  calcium-vitamin D (OSCAL WITH D) 500-200 MG-UNIT per tablet Take 1 tablet by mouth at bedtime.     [provider]  citalopram (CELEXA) 40 MG tablet Take 1 tablet (40 mg total) by mouth daily with breakfast. 07/26/14   Panosh, Neta Mends, MD  Cyanocobalamin 1000 MCG/ML LIQD ORAL, Daily, 0 Refill(s) 07/11/20   [provider]  diphenhydramine-acetaminophen (TYLENOL PM) 25-500 MG TABS tablet Take 1 tablet by mouth at bedtime as needed.    [provider]  doxycycline (VIBRAMYCIN) 100 MG capsule Take by mouth.    [provider]  EPINEPHrine 0.3  mg/0.3 mL IJ SOAJ injection into the thigh. 11/11/20   [provider]  fexofenadine (ALLEGRA) 180 MG tablet Take 1 tablet (180 mg total) by mouth daily. 03/01/22 08/28/22  Theadora Rama Scales, PA-C  fluticasone (FLONASE) 50 MCG/ACT nasal spray Place 1 spray into both nostrils daily. Begin by using 2 sprays in each nare daily for 3 to 5 days, then decrease to 1 spray in each nare daily. 03/01/22   Theadora Rama Scales, PA-C  hydrochlorothiazide (HYDRODIURIL) 12.5 MG tablet Take by mouth. 01/15/21   [provider]  hydrOXYzine (ATARAX) 25 MG tablet TAKE ONE TABLET (25 MG) BY MOUTH 3 TIMESDAILY AS NEEDED FOR ANXIETY FOR UP TO 10DAYS 05/06/22   [provider]  lisinopril-hydrochlorothiazide (PRINZIDE,ZESTORETIC) 20-12.5 MG tablet TAKE ONE TABLET BY MOUTH ONCE DAILY WITH  BREAKFAST 07/15/15   Panosh, Neta Mends, MD  montelukast (SINGULAIR) 10 MG tablet Take 1 tablet (10 mg total) by mouth at bedtime. 03/01/22 07/01/22  Theadora Rama Scales, PA-C  Multiple Vitamin (MULTIVITAMIN WITH MINERALS) TABS Take 1 tablet by mouth at bedtime. Biactric pal with iron    [provider]  olopatadine (PATANOL) 0.1 % ophthalmic solution 1 drop 2 (two) times daily. 01/27/22   [provider]  omeprazole (PRILOSEC) 40 MG capsule Take by mouth. 01/15/21   [provider]  ondansetron (ZOFRAN) 4 MG tablet every 12 (twelve) hours as needed 04/24/21  [provider]  Rimegepant Sulfate (NURTEC) 75 MG TBDP Take by mouth. 06/19/21   [provider]  rizatriptan (MAXALT) 10 MG tablet Take by mouth. 12/25/21   [provider]  triamcinolone (NASACORT) 55 MCG/ACT AERO nasal inhaler 2 sprays 2 (two) times daily. 01/27/22   [provider]  promethazine (PHENERGAN) 25 MG tablet Take 1 tablet (25 mg total) by mouth every 6 (six) hours as needed for nausea. Can make you drowsy, do not mix with Norco and Muscle Relaxer. 01/23/13 04/09/14  Le, Thao P, DO       Allergies    Acetazolamide and Codeine    Review of Systems   Review of Systems  All other systems reviewed and are negative.   Physical Exam Updated Vital Signs BP 136/81   Pulse 91   Temp 98.3 F (36.8 C)   Resp 18   LMP 06/19/2012   SpO2 100%  Physical Exam Vitals and nursing note reviewed.  Constitutional:      General: She is not in acute distress.    Appearance: She is well-developed. She is not diaphoretic.  HENT:     Head: Normocephalic and atraumatic.  Cardiovascular:     Rate and Rhythm: Normal rate and regular rhythm.     Heart sounds: No murmur heard.    No friction rub. No gallop.  Pulmonary:     Effort: Pulmonary effort is normal. No respiratory distress.     Breath sounds: Normal breath sounds. No wheezing.  Abdominal:     General: Bowel sounds are normal. There is no distension.     Palpations: Abdomen is soft.     Tenderness: There is abdominal tenderness. There is no guarding or rebound.     Comments: Is tenderness to palpation in left upper quadrant.  Musculoskeletal:        General: Normal range of motion.     Cervical back: Normal range of motion and neck supple.  Skin:    General: Skin is warm and dry.  Neurological:     General: No focal deficit present.     Mental Status: She is alert and oriented to person, place, and time.     ED Results / Procedures / Treatments   Labs (all labs ordered are listed, but only abnormal results are displayed) Labs Reviewed  BASIC METABOLIC PANEL WITH GFR  CBC  TROPONIN I (HIGH SENSITIVITY)  TROPONIN I (HIGH SENSITIVITY)    EKG None  Radiology DG Chest 2 View Result Date: 07/10/2023 CLINICAL DATA:  Chest/rib pain EXAM: CHEST - 2 VIEW COMPARISON:  January 23, 2013 FINDINGS: The cardiomediastinal silhouette is unchanged in contour. No pleural effusion. No pneumothorax. No acute pleuroparenchymal abnormality. Visualized abdomen is unremarkable. Mild multilevel degenerative changes of the  thoracic spine. No acute displaced rib fracture visualized within the limitations of this exam. IMPRESSION: No acute cardiopulmonary abnormality. Electronically Signed   By: Meda Klinefelter M.D.   On: 07/10/2023 21:40    Procedures Procedures  {Document cardiac monitor, telemetry assessment procedure when appropriate:1}  Medications Ordered in ED Medications  ketorolac (TORADOL) 30 MG/ML injection 30 mg (has no administration in time range)  ondansetron (ZOFRAN) injection 4 mg (has no administration in time range)  sodium chloride 0.9 % bolus 1,000 mL (has no administration in time range)  iohexol (OMNIPAQUE) 300 MG/ML solution 100 mL (has no administration in time range)    ED Course/ Medical Decision Making/ A&P   {   Click here for ABCD2,  HEART and other calculatorsREFRESH Note before signing :1}                              Medical Decision Making Amount and/or Complexity of Data Reviewed Labs: ordered. Radiology: ordered.  Risk Prescription drug management.   ***  {Document critical care time when appropriate:1} {Document review of labs and clinical decision tools ie heart score, Chads2Vasc2 etc:1}  {Document your independent review of radiology images, and any outside records:1} {Document your discussion with family members, caretakers, and with consultants:1} {Document social determinants of health affecting pt's care:1} {Document your decision making why or why not admission, treatments were needed:1} Final Clinical Impression(s) / ED Diagnoses Final diagnoses:  None    Rx / DC Orders ED Discharge Orders     None

## 2023-07-10 NOTE — ED Triage Notes (Signed)
 Pt states that she started having upper left sided rib and left shoulder pain that started on Friday. Pt has had no relief with home medications. Pt states that she vomited last night and states she thinks it was because she was in so much pain.

## 2023-07-10 NOTE — ED Provider Notes (Signed)
 [place] EMERGENCY DEPARTMENT AT [facility] Provider Note   CSN: [id number] Arrival date & time: 07/10/23  2031     History  Chief Complaint  Patient presents with   Chest Pain   Shoulder Pain    [name] is a 48 y.o. female.  Patient is a 48 year old female with past medical history of gastric bypass, cholecystectomy.  Patient presenting today with complaints of left upper quadrant pain.  This has been present for the past several days.  Symptoms initially began almost 3 weeks ago.  She was seen at that time by her primary doctor and started on Augmentin.  This seemed to help, however now symptoms have returned.  She describes a sharp, stabbing pain to the left upper quadrant that radiates up into her chest.  This is worse when she moves or palpates the area.  No alleviating factors.  No fevers or chills.  No bloody stools.      Home Medications Prior to Admission medications   Medication Sig Start Date End Date Taking? Authorizing Provider  acetaminophen (TYLENOL) 500 MG tablet Take by mouth. 07/15/20   [provider]  Biotin 10 MG TABS Take 1 tablet by mouth at bedtime.    [provider]  buPROPion (WELLBUTRIN XL) 300 MG 24 hr tablet Take 300 mg by mouth daily.    [provider]  busPIRone (BUSPAR) 5 MG tablet Take by mouth. 12/05/17   [provider]  calcium-vitamin D (OSCAL WITH D) 500-200 MG-UNIT per tablet Take 1 tablet by mouth at bedtime.     [provider]  citalopram (CELEXA) 40 MG tablet Take 1 tablet (40 mg total) by mouth daily with breakfast. 07/26/14   Panosh, Neta Mends, MD  Cyanocobalamin 1000 MCG/ML LIQD ORAL, Daily, 0 Refill(s) 07/11/20   [provider]  diphenhydramine-acetaminophen (TYLENOL PM) 25-500 MG TABS tablet Take 1 tablet by mouth at bedtime as needed.    [provider]  doxycycline (VIBRAMYCIN) 100 MG capsule Take by mouth.    [provider]  EPINEPHrine 0.3  mg/0.3 mL IJ SOAJ injection into the thigh. 11/11/20   [provider]  fexofenadine (ALLEGRA) 180 MG tablet Take 1 tablet (180 mg total) by mouth daily. 03/01/22 08/28/22  Theadora Rama Scales, PA-C  fluticasone (FLONASE) 50 MCG/ACT nasal spray Place 1 spray into both nostrils daily. Begin by using 2 sprays in each nare daily for 3 to 5 days, then decrease to 1 spray in each nare daily. 03/01/22   Theadora Rama Scales, PA-C  hydrochlorothiazide (HYDRODIURIL) 12.5 MG tablet Take by mouth. 01/15/21   [provider]  hydrOXYzine (ATARAX) 25 MG tablet TAKE ONE TABLET (25 MG) BY MOUTH 3 TIMESDAILY AS NEEDED FOR ANXIETY FOR UP TO 10DAYS 05/06/22   [provider]  lisinopril-hydrochlorothiazide (PRINZIDE,ZESTORETIC) 20-12.5 MG tablet TAKE ONE TABLET BY MOUTH ONCE DAILY WITH  BREAKFAST 07/15/15   Panosh, Neta Mends, MD  montelukast (SINGULAIR) 10 MG tablet Take 1 tablet (10 mg total) by mouth at bedtime. 03/01/22 07/01/22  Theadora Rama Scales, PA-C  Multiple Vitamin (MULTIVITAMIN WITH MINERALS) TABS Take 1 tablet by mouth at bedtime. Biactric pal with iron    [provider]  olopatadine (PATANOL) 0.1 % ophthalmic solution 1 drop 2 (two) times daily. 01/27/22   [provider]  omeprazole (PRILOSEC) 40 MG capsule Take by mouth. 01/15/21   [provider]  ondansetron (ZOFRAN) 4 MG tablet every 12 (twelve) hours as needed 04/24/21  [provider]  Rimegepant Sulfate (NURTEC) 75 MG TBDP Take by mouth. 06/19/21   [provider]  rizatriptan (MAXALT) 10 MG tablet Take by mouth. 12/25/21   [provider]  triamcinolone (NASACORT) 55 MCG/ACT AERO nasal inhaler 2 sprays 2 (two) times daily. 01/27/22   [provider]  promethazine (PHENERGAN) 25 MG tablet Take 1 tablet (25 mg total) by mouth every 6 (six) hours as needed for nausea. Can make you drowsy, do not mix with Norco and Muscle Relaxer. 01/23/13 04/09/14  Le, Thao P, DO       Allergies    Acetazolamide and Codeine    Review of Systems   Review of Systems  All other systems reviewed and are negative.   Physical Exam Updated Vital Signs BP 136/81   Pulse 91   Temp 98.3 F (36.8 C)   Resp 18   LMP 06/19/2012   SpO2 100%  Physical Exam Vitals and nursing note reviewed.  Constitutional:      General: She is not in acute distress.    Appearance: She is well-developed. She is not diaphoretic.  HENT:     Head: Normocephalic and atraumatic.  Cardiovascular:     Rate and Rhythm: Normal rate and regular rhythm.     Heart sounds: No murmur heard.    No friction rub. No gallop.  Pulmonary:     Effort: Pulmonary effort is normal. No respiratory distress.     Breath sounds: Normal breath sounds. No wheezing.  Abdominal:     General: Bowel sounds are normal. There is no distension.     Palpations: Abdomen is soft.     Tenderness: There is abdominal tenderness. There is no guarding or rebound.     Comments: Is tenderness to palpation in left upper quadrant.  Musculoskeletal:        General: Normal range of motion.     Cervical back: Normal range of motion and neck supple.  Skin:    General: Skin is warm and dry.  Neurological:     General: No focal deficit present.     Mental Status: She is alert and oriented to person, place, and time.    ED Results / Procedures / Treatments   Labs (all labs ordered are listed, but only abnormal results are displayed) Labs Reviewed  BASIC METABOLIC PANEL WITH GFR  CBC  TROPONIN I (HIGH SENSITIVITY)  TROPONIN I (HIGH SENSITIVITY)    EKG None  Radiology DG Chest 2 View Result Date: 07/10/2023 CLINICAL DATA:  Chest/rib pain EXAM: CHEST - 2 VIEW COMPARISON:  January 23, 2013 FINDINGS: The cardiomediastinal silhouette is unchanged in contour. No pleural effusion. No pneumothorax. No acute pleuroparenchymal abnormality. Visualized abdomen is unremarkable. Mild multilevel degenerative changes of the thoracic  spine. No acute displaced rib fracture visualized within the limitations of this exam. IMPRESSION: No acute cardiopulmonary abnormality. Electronically Signed   By: Meda Klinefelter M.D.   On: 07/10/2023 21:40    Procedures Procedures    Medications Ordered in ED Medications  ketorolac (TORADOL) 30 MG/ML injection 30 mg (has no administration in time range)  ondansetron (ZOFRAN) injection 4 mg (has no administration in time range)  sodium chloride 0.9 % bolus 1,000 mL (has no administration in time range)  iohexol (OMNIPAQUE) 300 MG/ML solution 100 mL (has no administration in time range)    ED Course/ Medical Decision Making/ A&P  Patient is a 48 year old female presenting with left upper quadrant pain as described in the HPI.  Patient arrives with stable vital signs and is afebrile.  There is tenderness to palpation in left upper quadrant, but exam is otherwise unremarkable.  Laboratory studies obtained including CBC, basic metabolic panel, and troponin, all of which are unremarkable.  CT scan of the abdomen and pelvis obtained showing no acute intra-abdominal pathology.  Chest x-ray is also clear.  Patient given IV Toradol and Zofran for pain and nausea and seems to be feeling better.  At this point, cause of her abdominal pain is unclear, however nothing appears emergent.  Patient to be discharged with continued use of Tylenol/ibuprofen, and follow-up with primary doctor.  Final Clinical Impression(s) / ED Diagnoses Final diagnoses:  None    Rx / DC Orders ED Discharge Orders     None         Geoffery Lyons, MD 07/11/23 [phone]

## 2023-07-11 LAB — TROPONIN I (HIGH SENSITIVITY): Troponin I (High Sensitivity): <2 ng/L (ref <18)

## 2023-07-11 MED ORDER — HYDROCODONE-ACETAMINOPHEN 5-325 MG PO TABS: 1.0000 | ORAL_TABLET | Freq: Four times a day (QID) | ORAL | 0 refills | Status: DC | PRN

## 2023-07-11 MED ORDER — HYDROCODONE-ACETAMINOPHEN 5-325 MG PO TABS: 1.0000 | ORAL_TABLET | Freq: Four times a day (QID) | ORAL | 0 refills | Status: AC | PRN

## 2023-07-11 NOTE — ED Notes (Signed)
 Patient transported to CT

## 2023-07-11 NOTE — Discharge Instructions (Signed)
 Continue taking Tylenol 1000 mg every 6 hours as needed for pain.  Follow-up with primary doctor/surgeon if not improving in the next week.

## 2023-07-13 ENCOUNTER — Other Ambulatory Visit: Payer: Self-pay | Admitting: *Deleted

## 2023-07-13 ENCOUNTER — Inpatient Hospital Stay: Payer: 59 | Attending: Oncology

## 2023-07-13 DIAGNOSIS — Z9884 Bariatric surgery status: Secondary | ICD-10-CM | POA: Diagnosis not present

## 2023-07-13 DIAGNOSIS — E611 Iron deficiency: Secondary | ICD-10-CM

## 2023-07-13 DIAGNOSIS — D509 Iron deficiency anemia, unspecified: Secondary | ICD-10-CM | POA: Diagnosis present

## 2023-07-13 LAB — CBC WITH DIFFERENTIAL/PLATELET
Abs Immature Granulocytes: 0.04 10*3/uL (ref 0.00–0.07)
Basophils Absolute: 0.1 10*3/uL (ref 0.0–0.1)
Basophils Relative: 1 %
Eosinophils Absolute: 0.2 10*3/uL (ref 0.0–0.5)
Eosinophils Relative: 1 %
HCT: 38 % (ref 36.0–46.0)
Hemoglobin: 12.6 g/dL (ref 12.0–15.0)
Immature Granulocytes: 0 %
Lymphocytes Relative: 16 %
Lymphs Abs: 1.8 10*3/uL (ref 0.7–4.0)
MCH: 30.1 pg (ref 26.0–34.0)
MCHC: 33.2 g/dL (ref 30.0–36.0)
MCV: 90.9 fL (ref 80.0–100.0)
Monocytes Absolute: 0.7 10*3/uL (ref 0.1–1.0)
Monocytes Relative: 6 %
Neutro Abs: 8.6 10*3/uL — ABNORMAL HIGH (ref 1.7–7.7)
Neutrophils Relative %: 76 %
Platelets: 348 10*3/uL (ref 150–400)
RBC: 4.18 MIL/uL (ref 3.87–5.11)
RDW: 12.4 % (ref 11.5–15.5)
WBC: 11.3 10*3/uL — ABNORMAL HIGH (ref 4.0–10.5)
nRBC: 0 % (ref 0.0–0.2)

## 2023-07-13 LAB — FERRITIN: Ferritin: 43 ng/mL (ref 11–307)

## 2023-07-13 LAB — IRON AND TIBC
Iron: 119 ug/dL (ref 28–170)
Saturation Ratios: 33 % — ABNORMAL HIGH (ref 10.4–31.8)
TIBC: 364 ug/dL (ref 250–450)
UIBC: 245 ug/dL

## 2023-07-13 NOTE — Progress Notes (Signed)
 c

## 2023-07-14 ENCOUNTER — Inpatient Hospital Stay: Payer: 59

## 2023-07-14 ENCOUNTER — Telehealth: Payer: Self-pay | Admitting: *Deleted

## 2023-07-14 ENCOUNTER — Other Ambulatory Visit: Payer: Self-pay | Admitting: Family Medicine

## 2023-07-14 ENCOUNTER — Inpatient Hospital Stay: Payer: 59 | Admitting: Oncology

## 2023-07-14 DIAGNOSIS — Z1231 Encounter for screening mammogram for malignant neoplasm of breast: Secondary | ICD-10-CM

## 2023-07-14 NOTE — Telephone Encounter (Signed)
 I sent the message to finnegan and the labs . The pt. Wanted to know based on what she saw it looked good and she can f/u to her PCP. Finnegan sent me message to tell the patient she did not need to come today the labs for over year was ok and she can go back to pcp . The  Patient is good for the results and not having to come back anymore

## 2023-07-14 NOTE — Telephone Encounter (Signed)
 She looked at her labs and what she saw, it look ok  and she wanted to see does she need to come to the appt today or f/u with her PCP. Wants a call back to know if she needs to come to appt today

## 2023-07-18 ENCOUNTER — Ambulatory Visit
Admission: RE | Admit: 2023-07-18 | Discharge: 2023-07-18 | Disposition: A | Discharge status: Home / Self Care | Source: Ambulatory Visit | Attending: Family Medicine | Admitting: Family Medicine

## 2023-07-18 DIAGNOSIS — Z1231 Encounter for screening mammogram for malignant neoplasm of breast: Secondary | ICD-10-CM | POA: Diagnosis present

## 2023-10-10 ENCOUNTER — Other Ambulatory Visit: Payer: Self-pay | Admitting: Family Medicine

## 2023-10-10 DIAGNOSIS — R1084 Generalized abdominal pain: Secondary | ICD-10-CM

## 2023-10-11 ENCOUNTER — Encounter: Payer: Self-pay | Admitting: Family Medicine

## 2023-10-24 ENCOUNTER — Ambulatory Visit
Admission: RE | Admit: 2023-10-24 | Discharge: 2023-10-24 | Disposition: A | Discharge status: Home / Self Care | Source: Ambulatory Visit | Attending: Family Medicine | Admitting: Family Medicine

## 2023-10-24 DIAGNOSIS — R1084 Generalized abdominal pain: Secondary | ICD-10-CM

## 2023-10-24 MED ORDER — IOPAMIDOL (ISOVUE-300) INJECTION 61%
100.0000 mL | Freq: Once | INTRAVENOUS | Status: AC | PRN
  Administered 2023-10-24: 100 mL via INTRAVENOUS

## 2023-11-04 ENCOUNTER — Encounter: Payer: Self-pay | Admitting: Neurology

## 2023-11-04 ENCOUNTER — Other Ambulatory Visit: Payer: Self-pay | Admitting: Neurology

## 2023-11-04 DIAGNOSIS — G43719 Chronic migraine without aura, intractable, without status migrainosus: Secondary | ICD-10-CM

## 2023-11-08 ENCOUNTER — Other Ambulatory Visit: Payer: Self-pay | Admitting: Orthopedic Surgery

## 2023-11-08 ENCOUNTER — Inpatient Hospital Stay
Admission: RE | Admit: 2023-11-08 | Discharge: 2023-11-08 | Disposition: A | Discharge status: Home / Self Care | Source: Ambulatory Visit | Attending: Neurology | Admitting: Neurology

## 2023-11-08 ENCOUNTER — Encounter: Payer: Self-pay | Admitting: Oncology

## 2023-11-08 DIAGNOSIS — G43719 Chronic migraine without aura, intractable, without status migrainosus: Secondary | ICD-10-CM

## 2023-11-08 DIAGNOSIS — M5416 Radiculopathy, lumbar region: Secondary | ICD-10-CM

## 2023-11-08 MED ORDER — GADOPICLENOL 0.5 MMOL/ML IV SOLN
7.5000 mL | Freq: Once | INTRAVENOUS | Status: AC | PRN
  Administered 2023-11-08: 7.5 mL via INTRAVENOUS

## 2023-11-28 ENCOUNTER — Other Ambulatory Visit

## 2023-11-28 ENCOUNTER — Inpatient Hospital Stay: Admission: RE | Admit: 2023-11-28 | Source: Ambulatory Visit

## 2024-02-21 ENCOUNTER — Ambulatory Visit
Admission: RE | Admit: 2024-02-21 | Discharge: 2024-02-21 | Disposition: A | Discharge status: Home / Self Care | Source: Ambulatory Visit | Attending: Orthopedic Surgery | Admitting: Orthopedic Surgery

## 2024-02-21 DIAGNOSIS — M5416 Radiculopathy, lumbar region: Secondary | ICD-10-CM

## 2024-02-27 ENCOUNTER — Other Ambulatory Visit: Payer: Self-pay | Admitting: Orthopedic Surgery

## 2024-02-27 DIAGNOSIS — M533 Sacrococcygeal disorders, not elsewhere classified: Secondary | ICD-10-CM

## 2024-03-20 ENCOUNTER — Inpatient Hospital Stay
Admission: RE | Admit: 2024-03-20 | Discharge: 2024-03-20 | Disposition: A | Source: Ambulatory Visit | Attending: Orthopedic Surgery | Admitting: Orthopedic Surgery

## 2024-03-20 ENCOUNTER — Ambulatory Visit
Admission: RE | Admit: 2024-03-20 | Discharge: 2024-03-20 | Disposition: A | Source: Ambulatory Visit | Attending: Orthopedic Surgery | Admitting: Orthopedic Surgery

## 2024-03-20 DIAGNOSIS — M533 Sacrococcygeal disorders, not elsewhere classified: Secondary | ICD-10-CM
